# Patient Record
Sex: Female | Born: 1950
Health system: Southern US, Community
[De-identification: ages and names within clinical notes are randomized; demographics above are authoritative.]

## PROBLEM LIST (undated history)

## (undated) DIAGNOSIS — E785 Hyperlipidemia, unspecified: Secondary | ICD-10-CM

## (undated) DIAGNOSIS — K589 Irritable bowel syndrome without diarrhea: Secondary | ICD-10-CM

## (undated) DIAGNOSIS — J45909 Unspecified asthma, uncomplicated: Secondary | ICD-10-CM

## (undated) DIAGNOSIS — R51 Headache: Secondary | ICD-10-CM

## (undated) DIAGNOSIS — Z8619 Personal history of other infectious and parasitic diseases: Secondary | ICD-10-CM

## (undated) DIAGNOSIS — J302 Other seasonal allergic rhinitis: Secondary | ICD-10-CM

## (undated) DIAGNOSIS — B059 Measles without complication: Secondary | ICD-10-CM

## (undated) DIAGNOSIS — R002 Palpitations: Secondary | ICD-10-CM

## (undated) DIAGNOSIS — B019 Varicella without complication: Secondary | ICD-10-CM

## (undated) DIAGNOSIS — G4700 Insomnia, unspecified: Secondary | ICD-10-CM

## (undated) HISTORY — DX: Hyperlipidemia, unspecified: E78.5

## (undated) HISTORY — DX: Unspecified asthma, uncomplicated: J45.909

## (undated) HISTORY — DX: Insomnia, unspecified: G47.00

## (undated) HISTORY — PX: BREAST SURGERY: SHX581

## (undated) HISTORY — DX: Irritable bowel syndrome, unspecified: K58.9

## (undated) HISTORY — PX: BREAST BIOPSY: SHX20

## (undated) HISTORY — PX: BREAST EXCISIONAL BIOPSY: SUR124

## (undated) HISTORY — DX: Palpitations: R00.2

## (undated) HISTORY — DX: Other seasonal allergic rhinitis: J30.2

## (undated) HISTORY — PX: MANDIBLE SURGERY: SHX707

## (undated) HISTORY — DX: Varicella without complication: B01.9

## (undated) HISTORY — DX: Measles without complication: B05.9

## (undated) HISTORY — DX: Headache: R51

## (undated) HISTORY — DX: Personal history of other infectious and parasitic diseases: Z86.19

---

## 1970-07-31 HISTORY — PX: WISDOM TOOTH EXTRACTION: SHX21

## 1981-07-31 HISTORY — PX: DILATION AND CURETTAGE OF UTERUS: SHX78

## 1998-05-25 ENCOUNTER — Other Ambulatory Visit: Admission: RE | Admit: 1998-05-25 | Discharge: 1998-05-25 | Payer: Self-pay | Admitting: Obstetrics and Gynecology

## 2000-06-05 ENCOUNTER — Other Ambulatory Visit: Admission: RE | Admit: 2000-06-05 | Discharge: 2000-06-05 | Payer: Self-pay | Admitting: Obstetrics and Gynecology

## 2001-07-10 ENCOUNTER — Other Ambulatory Visit: Admission: RE | Admit: 2001-07-10 | Discharge: 2001-07-10 | Payer: Self-pay | Admitting: Obstetrics and Gynecology

## 2002-06-06 ENCOUNTER — Ambulatory Visit (HOSPITAL_COMMUNITY): Admission: RE | Admit: 2002-06-06 | Discharge: 2002-06-06 | Payer: Self-pay | Admitting: Gastroenterology

## 2002-07-31 LAB — HM COLONOSCOPY: HM Colonoscopy: NORMAL

## 2002-10-28 ENCOUNTER — Other Ambulatory Visit: Admission: RE | Admit: 2002-10-28 | Discharge: 2002-10-28 | Payer: Self-pay | Admitting: Obstetrics and Gynecology

## 2003-11-17 ENCOUNTER — Other Ambulatory Visit: Admission: RE | Admit: 2003-11-17 | Discharge: 2003-11-17 | Payer: Self-pay | Admitting: Obstetrics and Gynecology

## 2005-02-22 ENCOUNTER — Other Ambulatory Visit: Admission: RE | Admit: 2005-02-22 | Discharge: 2005-02-22 | Payer: Self-pay | Admitting: Obstetrics and Gynecology

## 2008-11-28 DEATH — deceased

## 2010-12-05 ENCOUNTER — Encounter: Payer: Self-pay | Admitting: Internal Medicine

## 2010-12-05 ENCOUNTER — Ambulatory Visit (INDEPENDENT_AMBULATORY_CARE_PROVIDER_SITE_OTHER): Payer: 59 | Admitting: Internal Medicine

## 2010-12-05 ENCOUNTER — Encounter: Payer: Self-pay | Admitting: *Deleted

## 2010-12-05 VITALS — BP 141/88 | HR 73 | Temp 97.2°F | Ht 63.0 in | Wt 128.4 lb

## 2010-12-05 DIAGNOSIS — J069 Acute upper respiratory infection, unspecified: Secondary | ICD-10-CM

## 2010-12-05 NOTE — Progress Notes (Signed)
Pt presents very congested, states fevers this weekend, general malaise. Voice very raspy. Will add to dr Reche Dixon [phone]

## 2010-12-05 NOTE — Progress Notes (Signed)
  Subjective:    Patient ID: Norma Greene, female    DOB: 04/09/1951, 60 y.o.   MRN: [id number]  HPI Here with 3 day history of dry cough, sore throat, hoarseness. No fevers, pleuritic sx's, SOB or chest tightness   Review of Systems     Objective:   Physical Exam Appears well, NAD Voice a little hoarse HEENT: No OP erythema                TM normal bilat                 Shotty cervical LAD                 Lungs CTA                  CAR: RRR no M/R/G       Assessment & Plan:  URI, most likely viral Continue OTC Meds Rest, fluids

## 2010-12-09 ENCOUNTER — Other Ambulatory Visit: Payer: Self-pay | Admitting: Internal Medicine

## 2010-12-09 MED ORDER — GUAIFENESIN-CODEINE 100-10 MG/5ML PO SYRP: ORAL_SOLUTION | ORAL | Status: DC

## 2010-12-09 MED ORDER — AZITHROMYCIN 250 MG PO TABS: ORAL_TABLET | ORAL | Status: AC

## 2010-12-09 NOTE — Progress Notes (Signed)
Norma Greene was seen earlier in the week for URI symptoms which have persisted and worsened. She has cough occasionally productive of yellow-green sputum; this is keeping her awake at night, laryngitis, 8 days of symptoms, and fatigue, malaise. On exam lungs are clear, as are TM's. Prescriptions for Zpak and narcotic cough syrup called in by me. Patient instructed to go home and rest.

## 2011-08-29 ENCOUNTER — Encounter: Payer: Self-pay | Admitting: Family Medicine

## 2011-08-29 ENCOUNTER — Ambulatory Visit (INDEPENDENT_AMBULATORY_CARE_PROVIDER_SITE_OTHER): Payer: 59 | Admitting: Family Medicine

## 2011-08-29 VITALS — BP 119/80 | HR 65 | Temp 98.1°F | Ht 64.0 in | Wt 124.0 lb

## 2011-08-29 DIAGNOSIS — M25562 Pain in left knee: Secondary | ICD-10-CM

## 2011-08-29 DIAGNOSIS — M25569 Pain in unspecified knee: Secondary | ICD-10-CM

## 2011-08-31 ENCOUNTER — Encounter: Payer: Self-pay | Admitting: Family Medicine

## 2011-08-31 DIAGNOSIS — M25562 Pain in left knee: Secondary | ICD-10-CM | POA: Insufficient documentation

## 2011-08-31 NOTE — Assessment & Plan Note (Signed)
Location of patient's pain and description, exam all consistent with a small lateral plica of her left knee.  No evidence ligamentous or meniscal injury.  Advised in a home exercise program (straight leg raises, VMO exercises primarily).  Ice knee, tylenol/motrin as needed.  Advised if not improving would consider formal PT and/or cortisone injection into area of plica.  F/u prn.

## 2011-08-31 NOTE — Progress Notes (Signed)
  Subjective:    Patient ID: Norma Greene, female    DOB: 1951-06-08, 61 y.o.   MRN: [id number]  PCP: Elby Showers  HPI 61 yo F here for left knee pain.  Patient denies known injury. She states approximately 3 weeks ago when getting up into bed she felt a sharp twinge superficially lateral to left kneecap. Pain was fleeting, not associated with swelling or bruising. Feels this mainly when knee is bent, climbing stairs. No locking, giving out.  Some catching in this location. No prior left knee injuries/surgeries.  Past Medical History  Diagnosis Date  . Allergy     No current outpatient prescriptions on file prior to visit.    History reviewed. No pertinent past surgical history.  No Known Allergies  History   Social History  . Marital Status: Single    Spouse Name: N/A    Number of Children: N/A  . Years of Education: N/A   Occupational History  . Not on file.   Social History Main Topics  . Smoking status: Never Smoker   . Smokeless tobacco: Not on file  . Alcohol Use: Yes  . Drug Use: Yes  . Sexually Active: Not on file   Other Topics Concern  . Not on file   Social History Narrative  . No narrative on file    Family History  Problem Relation Age of Onset  . Hypertension Father   . Hyperlipidemia Father   . Diabetes Neg Hx   . Heart attack Neg Hx   . Sudden death Neg Hx     BP 119/80  Pulse 65  Temp(Src) 98.1 F (36.7 C) (Oral)  Ht 5\' 4"  (1.626 m)  Wt 124 lb (56.246 kg)  BMI 21.28 kg/m2  Review of Systems See HPI above.    Objective:   Physical Exam Gen: NAD  L knee: No gross deformity, ecchymoses, swelling.  Palpable small soft tissue band felt between lateral patella and lateral femoral condyle. No TTP joint lines, post patellar facets, elsewhere about knee. FROM without j sign. Negative ant/post drawers. Negative valgus/varus testing. Negative lachmanns. Negative mcmurrays, apleys, patellar apprehension, clarkes. NV intact  distally. 5/5 strength left hip abduction.  R knee: FROM without pain, instability.    Assessment & Plan:  1. Left knee pain - Location of patient's pain and description, exam all consistent with a small lateral plica of her left knee.  No evidence ligamentous or meniscal injury.  Advised in a home exercise program (straight leg raises, VMO exercises primarily).  Ice knee, tylenol/motrin as needed.  Advised if not improving would consider formal PT and/or cortisone injection into area of plica.  F/u prn.

## 2013-11-28 LAB — HM PAP SMEAR: HM Pap smear: NORMAL

## 2013-11-28 LAB — HM MAMMOGRAPHY: HM MAMMO: NORMAL

## 2013-12-10 ENCOUNTER — Encounter: Payer: Self-pay | Admitting: Internal Medicine

## 2013-12-10 ENCOUNTER — Ambulatory Visit (INDEPENDENT_AMBULATORY_CARE_PROVIDER_SITE_OTHER): Payer: 59 | Admitting: Internal Medicine

## 2013-12-10 VITALS — BP 132/82 | HR 67 | Temp 96.7°F | Resp 20 | Ht 63.5 in | Wt 118.5 lb

## 2013-12-10 DIAGNOSIS — B0239 Other herpes zoster eye disease: Secondary | ICD-10-CM | POA: Insufficient documentation

## 2013-12-10 MED ORDER — GABAPENTIN 300 MG PO CAPS: 300.0000 mg | ORAL_CAPSULE | Freq: Two times a day (BID) | ORAL | Status: DC

## 2013-12-10 NOTE — Assessment & Plan Note (Signed)
Has completed 2 courses of Famcyclovir, seems to be improving, but has persistent neuropathy described as shooting and ants crawling on skin.  She is being closely followed by opthalmology (Dr. Estrella DeedsJohn Yoakum).  She has received relief with Gabapentin 300mg , 1 tab, 1-2x daily.  -Refill Gabapentin 300mg  BID #60, plan to taper down to daily in 1-2 weeks, and hopeful to taper off -RTC if symptoms do not resolve

## 2013-12-10 NOTE — Progress Notes (Signed)
Subjective:   Patient ID: Norma Greene female   DOB: 08/24/1950 63 y.o.   MRN: [id number]  Chief Complaint  Patient presents with  . shingles neuropathy around right face and head x 4 week    requesting Neurontin for control of neuropathy   HPI: Norma Greene is a 63 y.o. woman with h/o IBS who presents for acute visit.  Symptoms started about 5 weeks ago, initially noticed a shooting pain in the throughout the right side of her head followed by tingling for 3 days, and then rash (bumps) began to erupt.  On 4th day [facility] (Dr. Caswell CorwinJarralla, took over Dr. Claris CheWalsh's patients) and was prescribed famcyclovir TID x 7d.  Same day went to optho (Dr. Lillia PaulsYoacom), told looked okay, return in 1 week.  At that point could barely open eye due to eye swelling.  Completed famcyclovir course, but since no sig improvement, optho noticed something on cornea, and re-prescribed famcyclovir BID x 7 days.  Into the 1st week, she started taking Gabapentin 300mg  qHS #28.  Gabapentin alleviated shooting pain, but during the day couldn't stand to wear glasses - started trying gabapentin in the AM, and could tolerate glasses.  Continues to have some pruritis (feels like ants crawling), shooting pains and lesions seems to be improving but remains tender on the right side.    Optho appt on 12/01/13 with Dr. Estrella DeedsJohn Yoakum: Right Eye 20/70, Left eye 20/30 - this is her baseline -Patient advised that contact lens wear stimulates the virus.  Unclear why PCP wouldn't prescribe.  May 22nd - appt with optho  Review of Systems: Constitutional: Denies fever, chills, diaphoresis. +Decreased appetite and energy HEENT: Denies hearing loss, ear pain, congestion, sore throat, mouth sores, trouble swallowing, neck pain, neck stiffness and tinnitus. +photophobia, occ eye pain, intermittent red eye - uses alaway eye drops BID; +rhinorrhea, sneezing Respiratory: Denies SOB, DOE, cough, chest tightness, and wheezing.    Cardiovascular: Denies chest pain, palpitations and leg swelling.  Gastrointestinal: Denies nausea, vomiting, abdominal pain, diarrhea, constipation,blood in stool and abdominal distention.  Genitourinary: Denies dysuria, urgency, frequency, hematuria, flank pain and difficulty urinating.  Musculoskeletal: Denies myalgias, back pain, joint swelling, arthralgias and gait problem.  Skin: Denies pallor, rash and wound.  Neurological: Denies dizziness, seizures, syncope, weakness, lightheadedness, numbness   Hematological: no obvious s/s of bleeding    Past Medical History  Diagnosis Date  . Allergy    Current Outpatient Prescriptions  Medication Sig Dispense Refill  . fexofenadine (ALLEGRA) 180 MG tablet Take 180 mg by mouth daily.      Marland Kitchen. PEG 3350-[id number]-[id number] (GOLYTELY PO) Take by mouth.       No current facility-administered medications for this visit.   Family History  Problem Relation Age of Onset  . Hypertension Father   . Hyperlipidemia Father   . Diabetes Neg Hx   . Heart attack Neg Hx   . Sudden death Neg Hx   . Heart failure Father   . Lung cancer Father   . Atrial fibrillation Father   . Atrial fibrillation Mother    History   Social History  . Marital Status: Single    Spouse Name: N/A    Number of Children: 5  . Years of Education: Nursing [employer]   Occupational History  .  [employer]   Social History Main Topics  . Smoking status: Never Smoker   . Smokeless tobacco: None  . Alcohol Use: Yes  Comment: Wine on weekends  . Drug Use: No  . Sexual Activity: None   Other Topics Concern  . None   Social History Narrative  . None    Objective:  Physical Exam: Filed Vitals:   12/10/13 1313  BP: 132/82  Pulse: 67  Temp: 96.7 F (35.9 C)  TempSrc: Oral  Resp: 20  Height: 5' 3.5" (1.613 m)  Weight: 118 lb 8 oz (53.751 kg)  SpO2: 99%   General: resting in bed HEENT: PERRL, EOMI, no scleral icterus, erythema surrounding skin on right eye  extends up through forehead with largest lesion over right lateral eyebrow but without significant fluid, barely raised, vision grossly intact Cardiac: RRR, no rubs, murmurs or gallops Pulm: clear to auscultation bilaterally, moving normal volumes of air Abd: soft, nontender, nondistended, BS present Ext: warm and well perfused, no pedal edema Neuro: alert and oriented X3, cranial nerves II-XII grossly intact  Assessment & Plan:   Case and care discussed with Dr. Meredith PelJoines.  Please see problem oriented charting for further details.

## 2013-12-10 NOTE — Progress Notes (Signed)
Case discussed with Dr. Sharda at the time of the visit.  We reviewed the resident's history and exam and pertinent patient test results.  I agree with the assessment, diagnosis, and plan of care documented in the resident's note.    

## 2013-12-10 NOTE — Patient Instructions (Signed)
-  You may take Gabapentin 300mg  twice daily - try to taper down to once daily in 1-2 weeks  Please be sure to bring all of your medications with you to every visit.  Should you have any new or worsening symptoms, please be sure to call the clinic at [phone].

## 2014-05-12 ENCOUNTER — Encounter: Payer: Self-pay | Admitting: Family Medicine

## 2014-05-12 ENCOUNTER — Ambulatory Visit (INDEPENDENT_AMBULATORY_CARE_PROVIDER_SITE_OTHER): Payer: 59 | Admitting: Family Medicine

## 2014-05-12 VITALS — BP 116/73 | HR 60 | Temp 98.5°F | Ht 63.5 in | Wt 115.2 lb

## 2014-05-12 DIAGNOSIS — G4700 Insomnia, unspecified: Secondary | ICD-10-CM

## 2014-05-12 DIAGNOSIS — T7840XA Allergy, unspecified, initial encounter: Secondary | ICD-10-CM

## 2014-05-12 DIAGNOSIS — R519 Headache, unspecified: Secondary | ICD-10-CM

## 2014-05-12 DIAGNOSIS — Z8619 Personal history of other infectious and parasitic diseases: Secondary | ICD-10-CM | POA: Insufficient documentation

## 2014-05-12 DIAGNOSIS — R51 Headache: Secondary | ICD-10-CM

## 2014-05-12 DIAGNOSIS — K589 Irritable bowel syndrome without diarrhea: Secondary | ICD-10-CM

## 2014-05-12 DIAGNOSIS — B059 Measles without complication: Secondary | ICD-10-CM | POA: Insufficient documentation

## 2014-05-12 NOTE — Progress Notes (Signed)
Pre visit review using our clinic review tool, if applicable. No additional management support is needed unless otherwise documented below in the visit note. 

## 2014-05-12 NOTE — Patient Instructions (Signed)
Curcumen/Turmeric daily Krill Oil caps daily Vitamin D 2000 IU dialy Probiotic daily such as [employer] or [place] at [email or web address]  Zinc and elderberry for colds   Preventive Care for Adults A healthy lifestyle and preventive care can promote health and wellness. Preventive health guidelines for women include the following key practices.  A routine yearly physical is a good way to check with your health care provider about your health and preventive screening. It is a chance to share any concerns and updates on your health and to receive a thorough exam.  Visit your dentist for a routine exam and preventive care every 6 months. Brush your teeth twice a day and floss once a day. Good oral hygiene prevents tooth decay and gum disease.  The frequency of eye exams is based on your age, health, family medical history, use of contact lenses, and other factors. Follow your health care provider's recommendations for frequency of eye exams.  Eat a healthy diet. Foods like vegetables, fruits, whole grains, low-fat dairy products, and lean protein foods contain the nutrients you need without too many calories. Decrease your intake of foods high in solid fats, added sugars, and salt. Eat the right amount of calories for you.Get information about a proper diet from your health care provider, if necessary.  Regular physical exercise is one of the most important things you can do for your health. Most adults should get at least 150 minutes of moderate-intensity exercise (any activity that increases your heart rate and causes you to sweat) each week. In addition, most adults need muscle-strengthening exercises on 2 or more days a week.  Maintain a healthy weight. The body mass index (BMI) is a screening tool to identify possible weight problems. It provides an estimate of body fat based on height and weight. Your health care provider can find your BMI and can help you  achieve or maintain a healthy weight.For adults 20 years and older:  A BMI below 18.5 is considered underweight.  A BMI of 18.5 to 24.9 is normal.  A BMI of 25 to 29.9 is considered overweight.  A BMI of 30 and above is considered obese.  Maintain normal blood lipids and cholesterol levels by exercising and minimizing your intake of saturated fat. Eat a balanced diet with plenty of fruit and vegetables. Blood tests for lipids and cholesterol should begin at age 42 and be repeated every 5 years. If your lipid or cholesterol levels are high, you are over 50, or you are at high risk for heart disease, you may need your cholesterol levels checked more frequently.Ongoing high lipid and cholesterol levels should be treated with medicines if diet and exercise are not working.  If you smoke, find out from your health care provider how to quit. If you do not use tobacco, do not start.  Lung cancer screening is recommended for adults aged 28-80 years who are at high risk for developing lung cancer because of a history of smoking. A yearly low-dose CT scan of the lungs is recommended for people who have at least a 30-pack-year history of smoking and are a current smoker or have quit within the past 15 years. A pack year of smoking is smoking an average of 1 pack of cigarettes a day for 1 year (for example: 1 pack a day for 30 years or 2 packs a day for 15 years). Yearly screening should continue until the smoker has stopped smoking for at least 15 years. Yearly  screening should be stopped for people who develop a health problem that would prevent them from having lung cancer treatment.  If you are pregnant, do not drink alcohol. If you are breastfeeding, be very cautious about drinking alcohol. If you are not pregnant and choose to drink alcohol, do not have more than 1 drink per day. One drink is considered to be 12 ounces (355 mL) of beer, 5 ounces (148 mL) of wine, or 1.5 ounces (44 mL) of liquor.  Avoid  use of street drugs. Do not share needles with anyone. Ask for help if you need support or instructions about stopping the use of drugs.  High blood pressure causes heart disease and increases the risk of stroke. Your blood pressure should be checked at least every 1 to 2 years. Ongoing high blood pressure should be treated with medicines if weight loss and exercise do not work.  If you are 109-59 years old, ask your health care provider if you should take aspirin to prevent strokes.  Diabetes screening involves taking a blood sample to check your fasting blood sugar level. This should be done once every 3 years, after age 16, if you are within normal weight and without risk factors for diabetes. Testing should be considered at a younger age or be carried out more frequently if you are overweight and have at least 1 risk factor for diabetes.  Breast cancer screening is essential preventive care for women. You should practice "breast self-awareness." This means understanding the normal appearance and feel of your breasts and may include breast self-examination. Any changes detected, no matter how small, should be reported to a health care provider. Women in their 60s and 30s should have a clinical breast exam (CBE) by a health care provider as part of a regular health exam every 1 to 3 years. After age 43, women should have a CBE every year. Starting at age 29, women should consider having a mammogram (breast X-ray test) every year. Women who have a family history of breast cancer should talk to their health care provider about genetic screening. Women at a high risk of breast cancer should talk to their health care providers about having an MRI and a mammogram every year.  Breast cancer gene (BRCA)-related cancer risk assessment is recommended for women who have family members with BRCA-related cancers. BRCA-related cancers include breast, ovarian, tubal, and peritoneal cancers. Having family members with  these cancers may be associated with an increased risk for harmful changes (mutations) in the breast cancer genes BRCA1 and BRCA2. Results of the assessment will determine the need for genetic counseling and BRCA1 and BRCA2 testing.  Routine pelvic exams to screen for cancer are no longer recommended for nonpregnant women who are considered low risk for cancer of the pelvic organs (ovaries, uterus, and vagina) and who do not have symptoms. Ask your health care provider if a screening pelvic exam is right for you.  If you have had past treatment for cervical cancer or a condition that could lead to cancer, you need Pap tests and screening for cancer for at least 20 years after your treatment. If Pap tests have been discontinued, your risk factors (such as having a new sexual partner) need to be reassessed to determine if screening should be resumed. Some women have medical problems that increase the chance of getting cervical cancer. In these cases, your health care provider may recommend more frequent screening and Pap tests.  The HPV test is an additional test  that may be used for cervical cancer screening. The HPV test looks for the virus that can cause the cell changes on the cervix. The cells collected during the Pap test can be tested for HPV. The HPV test could be used to screen women aged 82 years and older, and should be used in women of any age who have unclear Pap test results. After the age of 88, women should have HPV testing at the same frequency as a Pap test.  Colorectal cancer can be detected and often prevented. Most routine colorectal cancer screening begins at the age of 51 years and continues through age 11 years. However, your health care provider may recommend screening at an earlier age if you have risk factors for colon cancer. On a yearly basis, your health care provider may provide home test kits to check for hidden blood in the stool. Use of a small camera at the end of a tube, to  directly examine the colon (sigmoidoscopy or colonoscopy), can detect the earliest forms of colorectal cancer. Talk to your health care provider about this at age 20, when routine screening begins. Direct exam of the colon should be repeated every 5-10 years through age 92 years, unless early forms of pre-cancerous polyps or small growths are found.  People who are at an increased risk for hepatitis B should be screened for this virus. You are considered at high risk for hepatitis B if:  You were born in a country where hepatitis B occurs often. Talk with your health care provider about which countries are considered high risk.  Your parents were born in a high-risk country and you have not received a shot to protect against hepatitis B (hepatitis B vaccine).  You have HIV or AIDS.  You use needles to inject street drugs.  You live with, or have sex with, someone who has hepatitis B.  You get hemodialysis treatment.  You take certain medicines for conditions like cancer, organ transplantation, and autoimmune conditions.  Hepatitis C blood testing is recommended for all people born from [birth year] through 1965 and any individual with known risks for hepatitis C.  Practice safe sex. Use condoms and avoid high-risk sexual practices to reduce the spread of sexually transmitted infections (STIs). STIs include gonorrhea, chlamydia, syphilis, trichomonas, herpes, HPV, and human immunodeficiency virus (HIV). Herpes, HIV, and HPV are viral illnesses that have no cure. They can result in disability, cancer, and death.  You should be screened for sexually transmitted illnesses (STIs) including gonorrhea and chlamydia if:  You are sexually active and are younger than 24 years.  You are older than 24 years and your health care provider tells you that you are at risk for this type of infection.  Your sexual activity has changed since you were last screened and you are at an increased risk for chlamydia or  gonorrhea. Ask your health care provider if you are at risk.  If you are at risk of being infected with HIV, it is recommended that you take a prescription medicine daily to prevent HIV infection. This is called preexposure prophylaxis (PrEP). You are considered at risk if:  You are a heterosexual woman, are sexually active, and are at increased risk for HIV infection.  You take drugs by injection.  You are sexually active with a partner who has HIV.  Talk with your health care provider about whether you are at high risk of being infected with HIV. If you choose to begin PrEP, you should first  be tested for HIV. You should then be tested every 3 months for as long as you are taking PrEP.  Osteoporosis is a disease in which the bones lose minerals and strength with aging. This can result in serious bone fractures or breaks. The risk of osteoporosis can be identified using a bone density scan. Women ages 39 years and over and women at risk for fractures or osteoporosis should discuss screening with their health care providers. Ask your health care provider whether you should take a calcium supplement or vitamin D to reduce the rate of osteoporosis.  Menopause can be associated with physical symptoms and risks. Hormone replacement therapy is available to decrease symptoms and risks. You should talk to your health care provider about whether hormone replacement therapy is right for you.  Use sunscreen. Apply sunscreen liberally and repeatedly throughout the day. You should seek shade when your shadow is shorter than you. Protect yourself by wearing long sleeves, pants, a wide-brimmed hat, and sunglasses year round, whenever you are outdoors.  Once a month, do a whole body skin exam, using a mirror to look at the skin on your back. Tell your health care provider of new moles, moles that have irregular borders, moles that are larger than a pencil eraser, or moles that have changed in shape or  color.  Stay current with required vaccines (immunizations).  Influenza vaccine. All adults should be immunized every year.  Tetanus, diphtheria, and acellular pertussis (Td, Tdap) vaccine. Pregnant women should receive 1 dose of Tdap vaccine during each pregnancy. The dose should be obtained regardless of the length of time since the last dose. Immunization is preferred during the 27th-36th week of gestation. An adult who has not previously received Tdap or who does not know her vaccine status should receive 1 dose of Tdap. This initial dose should be followed by tetanus and diphtheria toxoids (Td) booster doses every 10 years. Adults with an unknown or incomplete history of completing a 3-dose immunization series with Td-containing vaccines should begin or complete a primary immunization series including a Tdap dose. Adults should receive a Td booster every 10 years.  Varicella vaccine. An adult without evidence of immunity to varicella should receive 2 doses or a second dose if she has previously received 1 dose. Pregnant females who do not have evidence of immunity should receive the first dose after pregnancy. This first dose should be obtained before leaving the health care facility. The second dose should be obtained 4-8 weeks after the first dose.  Human papillomavirus (HPV) vaccine. Females aged 13-26 years who have not received the vaccine previously should obtain the 3-dose series. The vaccine is not recommended for use in pregnant females. However, pregnancy testing is not needed before receiving a dose. If a female is found to be pregnant after receiving a dose, no treatment is needed. In that case, the remaining doses should be delayed until after the pregnancy. Immunization is recommended for any person with an immunocompromised condition through the age of 86 years if she did not get any or all doses earlier. During the 3-dose series, the second dose should be obtained 4-8 weeks after the  first dose. The third dose should be obtained 24 weeks after the first dose and 16 weeks after the second dose.  Zoster vaccine. One dose is recommended for adults aged 48 years or older unless certain conditions are present.  Measles, mumps, and rubella (MMR) vaccine. Adults born before [birth year] generally are considered immune to measles  and mumps. Adults born in [birth year] or later should have 1 or more doses of MMR vaccine unless there is a contraindication to the vaccine or there is laboratory evidence of immunity to each of the three diseases. A routine second dose of MMR vaccine should be obtained at least 28 days after the first dose for students attending postsecondary schools, health care workers, or international travelers. People who received inactivated measles vaccine or an unknown type of measles vaccine during 1963-1967 should receive 2 doses of MMR vaccine. People who received inactivated mumps vaccine or an unknown type of mumps vaccine before 1979 and are at high risk for mumps infection should consider immunization with 2 doses of MMR vaccine. For females of childbearing age, rubella immunity should be determined. If there is no evidence of immunity, females who are not pregnant should be vaccinated. If there is no evidence of immunity, females who are pregnant should delay immunization until after pregnancy. Unvaccinated health care workers born before [birth year] who lack laboratory evidence of measles, mumps, or rubella immunity or laboratory confirmation of disease should consider measles and mumps immunization with 2 doses of MMR vaccine or rubella immunization with 1 dose of MMR vaccine.  Pneumococcal 13-valent conjugate (PCV13) vaccine. When indicated, a person who is uncertain of her immunization history and has no record of immunization should receive the PCV13 vaccine. An adult aged 32 years or older who has certain medical conditions and has not been previously immunized should receive 1 dose of  PCV13 vaccine. This PCV13 should be followed with a dose of pneumococcal polysaccharide (PPSV23) vaccine. The PPSV23 vaccine dose should be obtained at least 8 weeks after the dose of PCV13 vaccine. An adult aged 7 years or older who has certain medical conditions and previously received 1 or more doses of PPSV23 vaccine should receive 1 dose of PCV13. The PCV13 vaccine dose should be obtained 1 or more years after the last PPSV23 vaccine dose.  Pneumococcal polysaccharide (PPSV23) vaccine. When PCV13 is also indicated, PCV13 should be obtained first. All adults aged 71 years and older should be immunized. An adult younger than age 51 years who has certain medical conditions should be immunized. Any person who resides in a nursing home or long-term care facility should be immunized. An adult smoker should be immunized. People with an immunocompromised condition and certain other conditions should receive both PCV13 and PPSV23 vaccines. People with human immunodeficiency virus (HIV) infection should be immunized as soon as possible after diagnosis. Immunization during chemotherapy or radiation therapy should be avoided. Routine use of PPSV23 vaccine is not recommended for American Indians, [place] Natives, or people younger than 65 years unless there are medical conditions that require PPSV23 vaccine. When indicated, people who have unknown immunization and have no record of immunization should receive PPSV23 vaccine. One-time revaccination 5 years after the first dose of PPSV23 is recommended for people aged 19-64 years who have chronic kidney failure, nephrotic syndrome, asplenia, or immunocompromised conditions. People who received 1-2 doses of PPSV23 before age 16 years should receive another dose of PPSV23 vaccine at age 47 years or later if at least 5 years have passed since the previous dose. Doses of PPSV23 are not needed for people immunized with PPSV23 at or after age 37 years.  Meningococcal vaccine.  Adults with asplenia or persistent complement component deficiencies should receive 2 doses of quadrivalent meningococcal conjugate (MenACWY-D) vaccine. The doses should be obtained at least 2 months apart. Microbiologists working with certain meningococcal bacteria,  [employer] recruits, people at risk during an outbreak, and people who travel to or live in countries with a high rate of meningitis should be immunized. A first-year college student up through age 40 years who is living in a residence hall should receive a dose if she did not receive a dose on or after her 16th birthday. Adults who have certain high-risk conditions should receive one or more doses of vaccine.  Hepatitis A vaccine. Adults who wish to be protected from this disease, have certain high-risk conditions, work with hepatitis A-infected animals, work in hepatitis A research labs, or travel to or work in countries with a high rate of hepatitis A should be immunized. Adults who were previously unvaccinated and who anticipate close contact with an international adoptee during the first 60 days after arrival in the [place] from a country with a high rate of hepatitis A should be immunized.  Hepatitis B vaccine. Adults who wish to be protected from this disease, have certain high-risk conditions, may be exposed to blood or other infectious body fluids, are household contacts or sex partners of hepatitis B positive people, are clients or workers in certain care facilities, or travel to or work in countries with a high rate of hepatitis B should be immunized.  Haemophilus influenzae type b (Hib) vaccine. A previously unvaccinated person with asplenia or sickle cell disease or having a scheduled splenectomy should receive 1 dose of Hib vaccine. Regardless of previous immunization, a recipient of a hematopoietic stem cell transplant should receive a 3-dose series 6-12 months after her successful transplant. Hib vaccine is not recommended for  adults with HIV infection. Preventive Services / Frequency Ages 12 to 24 years  Blood pressure check.** / Every 1 to 2 years.  Lipid and cholesterol check.** / Every 5 years beginning at age 10.  Clinical breast exam.** / Every 3 years for women in their 26s and 3s.  BRCA-related cancer risk assessment.** / For women who have family members with a BRCA-related cancer (breast, ovarian, tubal, or peritoneal cancers).  Pap test.** / Every 2 years from ages 12 through 50. Every 3 years starting at age 36 through age 57 or 32 with a history of 3 consecutive normal Pap tests.  HPV screening.** / Every 3 years from ages 64 through ages 91 to 72 with a history of 3 consecutive normal Pap tests.  Hepatitis C blood test.** / For any individual with known risks for hepatitis C.  Skin self-exam. / Monthly.  Influenza vaccine. / Every year.  Tetanus, diphtheria, and acellular pertussis (Tdap, Td) vaccine.** / Consult your health care provider. Pregnant women should receive 1 dose of Tdap vaccine during each pregnancy. 1 dose of Td every 10 years.  Varicella vaccine.** / Consult your health care provider. Pregnant females who do not have evidence of immunity should receive the first dose after pregnancy.  HPV vaccine. / 3 doses over 6 months, if 30 and younger. The vaccine is not recommended for use in pregnant females. However, pregnancy testing is not needed before receiving a dose.  Measles, mumps, rubella (MMR) vaccine.** / You need at least 1 dose of MMR if you were born in 1957 or later. You may also need a 2nd dose. For females of childbearing age, rubella immunity should be determined. If there is no evidence of immunity, females who are not pregnant should be vaccinated. If there is no evidence of immunity, females who are pregnant should delay immunization until after pregnancy.  Pneumococcal  13-valent conjugate (PCV13) vaccine.** / Consult your health care provider.  Pneumococcal  polysaccharide (PPSV23) vaccine.** / 1 to 2 doses if you smoke cigarettes or if you have certain conditions.  Meningococcal vaccine.** / 1 dose if you are age 52 to 61 years and a Market researcher living in a residence hall, or have one of several medical conditions, you need to get vaccinated against meningococcal disease. You may also need additional booster doses.  Hepatitis A vaccine.** / Consult your health care provider.  Hepatitis B vaccine.** / Consult your health care provider.  Haemophilus influenzae type b (Hib) vaccine.** / Consult your health care provider. Ages 68 to 71 years  Blood pressure check.** / Every 1 to 2 years.  Lipid and cholesterol check.** / Every 5 years beginning at age 55 years.  Lung cancer screening. / Every year if you are aged 77-80 years and have a 30-pack-year history of smoking and currently smoke or have quit within the past 15 years. Yearly screening is stopped once you have quit smoking for at least 15 years or develop a health problem that would prevent you from having lung cancer treatment.  Clinical breast exam.** / Every year after age 87 years.  BRCA-related cancer risk assessment.** / For women who have family members with a BRCA-related cancer (breast, ovarian, tubal, or peritoneal cancers).  Mammogram.** / Every year beginning at age 60 years and continuing for as long as you are in good health. Consult with your health care provider.  Pap test.** / Every 3 years starting at age 77 years through age 28 or 62 years with a history of 3 consecutive normal Pap tests.  HPV screening.** / Every 3 years from ages 45 years through ages 30 to 72 years with a history of 3 consecutive normal Pap tests.  Fecal occult blood test (FOBT) of stool. / Every year beginning at age 38 years and continuing until age 25 years. You may not need to do this test if you get a colonoscopy every 10 years.  Flexible sigmoidoscopy or colonoscopy.** / Every 5  years for a flexible sigmoidoscopy or every 10 years for a colonoscopy beginning at age 30 years and continuing until age 23 years.  Hepatitis C blood test.** / For all people born from [birth year] through 1965 and any individual with known risks for hepatitis C.  Skin self-exam. / Monthly.  Influenza vaccine. / Every year.  Tetanus, diphtheria, and acellular pertussis (Tdap/Td) vaccine.** / Consult your health care provider. Pregnant women should receive 1 dose of Tdap vaccine during each pregnancy. 1 dose of Td every 10 years.  Varicella vaccine.** / Consult your health care provider. Pregnant females who do not have evidence of immunity should receive the first dose after pregnancy.  Zoster vaccine.** / 1 dose for adults aged 38 years or older.  Measles, mumps, rubella (MMR) vaccine.** / You need at least 1 dose of MMR if you were born in 1957 or later. You may also need a 2nd dose. For females of childbearing age, rubella immunity should be determined. If there is no evidence of immunity, females who are not pregnant should be vaccinated. If there is no evidence of immunity, females who are pregnant should delay immunization until after pregnancy.  Pneumococcal 13-valent conjugate (PCV13) vaccine.** / Consult your health care provider.  Pneumococcal polysaccharide (PPSV23) vaccine.** / 1 to 2 doses if you smoke cigarettes or if you have certain conditions.  Meningococcal vaccine.** / Consult your health care provider.  Hepatitis A vaccine.** / Consult your health care provider.  Hepatitis B vaccine.** / Consult your health care provider.  Haemophilus influenzae type b (Hib) vaccine.** / Consult your health care provider. Ages 33 years and over  Blood pressure check.** / Every 1 to 2 years.  Lipid and cholesterol check.** / Every 5 years beginning at age 23 years.  Lung cancer screening. / Every year if you are aged 38-80 years and have a 30-pack-year history of smoking and currently  smoke or have quit within the past 15 years. Yearly screening is stopped once you have quit smoking for at least 15 years or develop a health problem that would prevent you from having lung cancer treatment.  Clinical breast exam.** / Every year after age 39 years.  BRCA-related cancer risk assessment.** / For women who have family members with a BRCA-related cancer (breast, ovarian, tubal, or peritoneal cancers).  Mammogram.** / Every year beginning at age 74 years and continuing for as long as you are in good health. Consult with your health care provider.  Pap test.** / Every 3 years starting at age 34 years through age 31 or 72 years with 3 consecutive normal Pap tests. Testing can be stopped between 65 and 70 years with 3 consecutive normal Pap tests and no abnormal Pap or HPV tests in the past 10 years.  HPV screening.** / Every 3 years from ages 3 years through ages 50 or 88 years with a history of 3 consecutive normal Pap tests. Testing can be stopped between 65 and 70 years with 3 consecutive normal Pap tests and no abnormal Pap or HPV tests in the past 10 years.  Fecal occult blood test (FOBT) of stool. / Every year beginning at age 36 years and continuing until age 40 years. You may not need to do this test if you get a colonoscopy every 10 years.  Flexible sigmoidoscopy or colonoscopy.** / Every 5 years for a flexible sigmoidoscopy or every 10 years for a colonoscopy beginning at age 57 years and continuing until age 49 years.  Hepatitis C blood test.** / For all people born from [birth year] through 1965 and any individual with known risks for hepatitis C.  Osteoporosis screening.** / A one-time screening for women ages 26 years and over and women at risk for fractures or osteoporosis.  Skin self-exam. / Monthly.  Influenza vaccine. / Every year.  Tetanus, diphtheria, and acellular pertussis (Tdap/Td) vaccine.** / 1 dose of Td every 10 years.  Varicella vaccine.** / Consult your  health care provider.  Zoster vaccine.** / 1 dose for adults aged 104 years or older.  Pneumococcal 13-valent conjugate (PCV13) vaccine.** / Consult your health care provider.  Pneumococcal polysaccharide (PPSV23) vaccine.** / 1 dose for all adults aged 19 years and older.  Meningococcal vaccine.** / Consult your health care provider.  Hepatitis A vaccine.** / Consult your health care provider.  Hepatitis B vaccine.** / Consult your health care provider.  Haemophilus influenzae type b (Hib) vaccine.** / Consult your health care provider. ** Family history and personal history of risk and conditions may change your health care provider's recommendations. Document Released: 09/12/2001 Document Revised: 12/01/2013 Document Reviewed: 12/12/2010 [facility] Patient Information 2015 [place], [place]. This information is not intended to replace advice given to you by your health care provider. Make sure you discuss any questions you have with your health care provider.

## 2014-05-17 ENCOUNTER — Encounter: Payer: Self-pay | Admitting: Family Medicine

## 2014-05-17 DIAGNOSIS — T7840XA Allergy, unspecified, initial encounter: Secondary | ICD-10-CM | POA: Insufficient documentation

## 2014-05-17 DIAGNOSIS — K589 Irritable bowel syndrome without diarrhea: Secondary | ICD-10-CM | POA: Insufficient documentation

## 2014-05-17 DIAGNOSIS — G4700 Insomnia, unspecified: Secondary | ICD-10-CM

## 2014-05-17 DIAGNOSIS — R519 Headache, unspecified: Secondary | ICD-10-CM

## 2014-05-17 DIAGNOSIS — R51 Headache: Secondary | ICD-10-CM

## 2014-05-17 HISTORY — DX: Headache, unspecified: R51.9

## 2014-05-17 HISTORY — DX: Insomnia, unspecified: G47.00

## 2014-05-17 NOTE — Assessment & Plan Note (Signed)
May use antihistamines daily

## 2014-05-17 NOTE — Progress Notes (Signed)
Patient ID: Norma Greene, female   DOB: 06/17/1951, 63 y.o.   MRN: [id number] Norma Greene [id number] 10/15/1950 05/17/2014      Progress Note New Patient  Subjective  Chief Complaint  Chief Complaint  Patient presents with  . Establish Care    new patient    HPI  Patient is a 63 year old female in today for routine medical care. Patient in today to establish care. She struggles with her headaches she believes her sinus congestion from allergies. Rarely has migrainous symptoms with nausea phobia and has chronic GI symptoms including constipation and diarrhea he manages it no recent acute illness. Follows with Dr. Troy SineMedhoff of gastroenterology. Follows with Dr. Marcelle OverlieHolland of GYN who manages her Paps, mammograms and DEXA scans. Denies CP/palp/SOB/HA/congestion/fevers/GI or GU c/o. Taking meds as prescribed  Past Medical History  Diagnosis Date  . Allergy   . IBS (irritable bowel syndrome)     Dr. Kinnie ScalesMedoff  . Chicken pox as a child  . Measles as a child  . Asthma     allergic  . Allergic state 05/17/2014  . Insomnia 05/17/2014  . Headache 05/17/2014    Past Surgical History  Procedure Laterality Date  . Mandible surgery  1985 and 1996  . Wisdom tooth extraction  1972  . Dilation and curettage of uterus  1983    missed AB  . Breast surgery      b/l lumpectomy    Family History  Problem Relation Age of Onset  . Hypertension Father   . Hyperlipidemia Father   . Heart failure Father   . Atrial fibrillation Father   . Cancer Father     lung, soft tissue in hand, thigh, basal cell in ear- smoker then quit  . Heart attack Neg Hx   . Sudden death Neg Hx   . Atrial fibrillation Mother   . Dementia Mother   . Hypertension Son   . Cancer Maternal Grandmother     ? breast cancer- masectomy  . Heart disease Paternal Grandmother   . Heart disease Paternal Grandfather   . Depression Daughter     History   Social History  . Marital Status: Married    Spouse Name: N/A   Number of Children: 5  . Years of Education: Nursing [employer]   Occupational History  .  [employer]   Social History Main Topics  . Smoking status: Never Smoker   . Smokeless tobacco: Never Used  . Alcohol Use: Yes     Comment: Wine on weekends  . Drug Use: No  . 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 Not on file   Social History Narrative  . No narrative on file    Current Outpatient Prescriptions on File Prior to Visit  Medication Sig Dispense Refill  . fexofenadine (ALLEGRA) 180 MG tablet Take 180 mg by mouth daily as needed.       Marland Kitchen. PEG 3350-KCl-NaBcb-NaCl-NaSulf (GOLYTELY PO) Take by mouth.       No current facility-administered medications on file prior to visit.    No Known Allergies  Review of Systems  Review of Systems  Constitutional: Negative for fever, chills and malaise/fatigue.  HENT: Positive for congestion. Negative for hearing loss and nosebleeds.   Eyes: Negative for discharge.  Respiratory: Negative for cough, sputum production,  shortness of breath and wheezing.   Cardiovascular: Negative for chest pain, palpitations and leg swelling.  Gastrointestinal: Negative for heartburn, nausea, vomiting, abdominal pain, diarrhea, constipation and blood in stool.  Genitourinary: Negative for dysuria, urgency, frequency and hematuria.  Musculoskeletal: Positive for joint pain. Negative for back pain, falls and myalgias.  Skin: Negative for rash.  Neurological: Positive for headaches. Negative for dizziness, tremors, sensory change, focal weakness, loss of consciousness and weakness.  Endo/Heme/Allergies: Negative for polydipsia. Does not bruise/bleed easily.  Psychiatric/Behavioral: Negative for depression and suicidal ideas. The patient is not nervous/anxious and does not have insomnia.     Objective  BP 116/73  Pulse 60   Temp(Src) 98.5 F (36.9 C) (Oral)  Ht 5' 3.5" (1.613 m)  Wt 115 lb 3.2 oz (52.254 kg)  BMI 20.08 kg/m2  SpO2 98%  Physical Exam  Physical Exam  Constitutional: She is oriented to person, place, and time and well-developed, well-nourished, and in no distress. No distress.  HENT:  Head: Normocephalic and atraumatic.  Right Ear: External ear normal.  Left Ear: External ear normal.  Nose: Nose normal.  Mouth/Throat: Oropharynx is clear and moist. No oropharyngeal exudate.  Eyes: Conjunctivae are normal. Pupils are equal, round, and reactive to light. Right eye exhibits no discharge. Left eye exhibits no discharge. No scleral icterus.  Neck: Normal range of motion. Neck supple. No thyromegaly present.  Cardiovascular: Normal rate, regular rhythm, normal heart sounds and intact distal pulses.   No murmur heard. Pulmonary/Chest: Effort normal and breath sounds normal. No respiratory distress. She has no wheezes. She has no rales.  Abdominal: Soft. Bowel sounds are normal. She exhibits no distension and no mass. There is no tenderness.  Musculoskeletal: Normal range of motion. She exhibits no edema and no tenderness.  Lymphadenopathy:    She has no cervical adenopathy.  Neurological: She is alert and oriented to person, place, and time. She has normal reflexes. No cranial nerve deficit. Coordination normal.  Skin: Skin is warm and dry. No rash noted. She is not diaphoretic.  Psychiatric: Mood, memory and affect normal.       Assessment & Plan  IBS (irritable bowel syndrome) Uses Golytely routinely to manage symptoms. Avoid offending foods, start probiotics. Do not eat large meals in late evening and consider raising head of bed.   Allergic state May use antihistamines daily   Insomnia Encouraged good sleep hygiene such as dark, quiet room. No blue/green glowing lights such as computer screens in bedroom. No alcohol or stimulants in evening. Cut down on caffeine as able. Regular  exercise is helpful but not just prior to bed time. Using Sominex prn, may continue  Headache Encouraged increased hydration, 64 ounces of clear fluids daily. Minimize alcohol and caffeine. Eat small frequent meals with lean proteins and complex carbs. Avoid high and low blood sugars. Get adequate sleep, 7-8 hours a night. Needs exercise daily preferably in the morning.

## 2014-05-17 NOTE — Assessment & Plan Note (Signed)
Encouraged good sleep hygiene such as dark, quiet room. No blue/green glowing lights such as computer screens in bedroom. No alcohol or stimulants in evening. Cut down on caffeine as able. Regular exercise is helpful but not just prior to bed time. Using Sominex prn, may continue

## 2014-05-17 NOTE — Assessment & Plan Note (Signed)
Encouraged increased hydration, 64 ounces of clear fluids daily. Minimize alcohol and caffeine. Eat small frequent meals with lean proteins and complex carbs. Avoid high and low blood sugars. Get adequate sleep, 7-8 hours a night. Needs exercise daily preferably in the morning.  

## 2014-05-17 NOTE — Assessment & Plan Note (Signed)
Uses Golytely routinely to manage symptoms. Avoid offending foods, start probiotics. Do not eat large meals in late evening and consider raising head of bed.

## 2014-07-21 ENCOUNTER — Encounter: Payer: Self-pay | Admitting: Family Medicine

## 2014-07-22 ENCOUNTER — Other Ambulatory Visit: Payer: Self-pay | Admitting: Family Medicine

## 2014-07-22 DIAGNOSIS — B0239 Other herpes zoster eye disease: Secondary | ICD-10-CM

## 2014-07-22 MED ORDER — GABAPENTIN 300 MG PO CAPS: 300.0000 mg | ORAL_CAPSULE | Freq: Two times a day (BID) | ORAL | Status: AC

## 2014-07-22 MED ORDER — ACYCLOVIR 800 MG PO TABS: 800.0000 mg | ORAL_TABLET | Freq: Every day | ORAL | Status: DC

## 2014-10-01 ENCOUNTER — Encounter: Payer: Self-pay | Admitting: Family Medicine

## 2014-11-06 ENCOUNTER — Ambulatory Visit (INDEPENDENT_AMBULATORY_CARE_PROVIDER_SITE_OTHER): Payer: 59 | Admitting: Physician Assistant

## 2014-11-06 ENCOUNTER — Encounter: Payer: Self-pay | Admitting: Physician Assistant

## 2014-11-06 VITALS — BP 132/75 | HR 56 | Temp 98.3°F | Resp 16 | Ht 63.5 in | Wt 118.2 lb

## 2014-11-06 DIAGNOSIS — J0100 Acute maxillary sinusitis, unspecified: Secondary | ICD-10-CM

## 2014-11-06 MED ORDER — HYDROCOD POLST-CHLORPHEN POLST 10-8 MG/5ML PO LQCR: 5.0000 mL | Freq: Two times a day (BID) | ORAL | Status: DC | PRN

## 2014-11-06 MED ORDER — AMOXICILLIN-POT CLAVULANATE 875-125 MG PO TABS: 1.0000 | ORAL_TABLET | Freq: Two times a day (BID) | ORAL | Status: DC

## 2014-11-06 NOTE — Progress Notes (Signed)
History of Present Illness: Norma Greene is a 64 y.o. female who present to the clinic today complaining of sinus pressure, sinus pain and nasal congestion x 2 weeks. Patient endorses fatigue, non productive cough and significant PND.  Patient denies chest pain or SOB.   History: Past Medical History  Diagnosis Date  . Allergy   . IBS (irritable bowel syndrome)     Dr. Kinnie ScalesMedoff  . Chicken pox as a child  . Measles as a child  . Asthma     allergic  . Allergic state 05/17/2014  . Insomnia 05/17/2014  . Headache 05/17/2014    Current outpatient prescriptions:  .  acyclovir (ZOVIRAX) 800 MG tablet, Take 1 tablet (800 mg total) by mouth 5 (five) times daily. Only fill if patient requests. (Patient taking differently: Take 800 mg by mouth 5 (five) times daily. Only fill if patient requests. PRN), Disp: 35 tablet, Rfl: 0 .  albuterol (PROVENTIL HFA;VENTOLIN HFA) 108 (90 BASE) MCG/ACT inhaler, Inhale into the lungs every 6 (six) hours as needed for wheezing or shortness of breath., Disp: , Rfl:  .  Calcium Carb-Cholecalciferol (CALCIUM + D3) 600-200 MG-UNIT TABS, Take by mouth., Disp: , Rfl:  .  dextromethorphan (DELSYM) 30 MG/5ML liquid, Take by mouth as needed for cough., Disp: , Rfl:  .  diphenhydrAMINE (SOMINEX) 25 MG tablet, Take 25 mg by mouth at bedtime as needed for sleep., Disp: , Rfl:  .  fexofenadine (ALLEGRA) 180 MG tablet, Take 180 mg by mouth daily as needed. , Disp: , Rfl:  .  gabapentin (NEURONTIN) 300 MG capsule, Take 1 capsule (300 mg total) by mouth 2 (two) times daily. (Patient taking differently: Take 300 mg by mouth 2 (two) times daily as needed. ), Disp: 60 capsule, Rfl: 0 .  guaiFENesin (MUCINEX) 600 MG 12 hr tablet, Take by mouth 2 (two) times daily., Disp: , Rfl:  .  ibuprofen (ADVIL,MOTRIN) 200 MG tablet, Take 200 mg by mouth every 6 (six) hours as needed., Disp: , Rfl:  .  PEG 3350-KCl-NaBcb-NaCl-NaSulf (GOLYTELY PO), Take by mouth 2 (two) times daily. 8 OUNCES  BID, Disp: , Rfl:  .  amoxicillin-clavulanate (AUGMENTIN) 875-125 MG per tablet, Take 1 tablet by mouth 2 (two) times daily., Disp: 20 tablet, Rfl: 0 .  chlorpheniramine-HYDROcodone (TUSSIONEX PENNKINETIC ER) 10-8 MG/5ML LQCR, Take 5 mLs by mouth every 12 (twelve) hours as needed., Disp: 115 mL, Rfl: 0 No Known Allergies Family History  Problem Relation Age of Onset  . Hypertension Father   . Hyperlipidemia Father   . Heart failure Father   . Atrial fibrillation Father   . Cancer Father     lung, soft tissue in hand, thigh, basal cell in ear- smoker then quit  . Heart attack Neg Hx   . Sudden death Neg Hx   . Atrial fibrillation Mother   . Dementia Mother   . Hypertension Son   . Cancer Maternal Grandmother     ? breast cancer- masectomy  . Heart disease Paternal Grandmother   . Heart disease Paternal Grandfather   . Depression Daughter    History   Social History  . Marital Status: Married    Spouse Name: N/A  . Number of Children: 5  . Years of Education: Nursing [employer]   Occupational History  .  [employer]   Social History Main Topics  . Smoking status: Never Smoker   . Smokeless tobacco: Never Used  . Alcohol Use: Yes  Comment: Wine on weekends  . Drug Use: No  . 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 None   Social History Narrative    Review of Systems: See HPI.  All other ROS are negative.  Physical Examination: BP 132/75 mmHg  Pulse 56  Temp(Src) 98.3 F (36.8 C) (Oral)  Resp 16  Ht 5' 3.5" (1.613 m)  Wt 118 lb 4 oz (53.638 kg)  BMI 20.62 kg/m2  SpO2 98%  General appearance: alert, cooperative and appears stated age Head: Normocephalic, without obvious abnormality, atraumatic, sinuses tender to percussion Eyes: conjunctivae/corneas clear. PERRL, EOM's intact. Fundi benign. Ears: normal TM's and external ear  canals both ears Nose: moderate congestion, turbinates swollen, sinus tenderness left Throat: lips, mucosa, and tongue normal; teeth and gums normal Neck: no adenopathy, no carotid bruit, no JVD, supple, symmetrical, trachea midline and thyroid not enlarged, symmetric, no tenderness/mass/nodules Lungs: clear to auscultation bilaterally Chest wall: no tenderness  Assessment/Plan: Acute maxillary sinusitis Rx Augmentin.  Increase fluids.  Rest.  Saline nasal spray.  Probiotic.  Mucinex as directed.  Humidifier in bedroom. Rx tussionex for cough.  Continue allergy medications.  Call or return to clinic if symptoms are not improving.

## 2014-11-06 NOTE — Patient Instructions (Signed)
Please take antibiotic as directed.  Increase fluid intake.  Use Saline nasal spray.  Take a daily multivitamin. Use Tussionex as directed for nighttime cough.  Continue Delsym of Mucinex-D for daytime cough.  Continue allergy medications.  Place a humidifier in the bedroom.  Please call or return clinic if symptoms are not improving.  Sinusitis Sinusitis is redness, soreness, and swelling (inflammation) of the paranasal sinuses. Paranasal sinuses are air pockets within the bones of your face (beneath the eyes, the middle of the forehead, or above the eyes). In healthy paranasal sinuses, mucus is able to drain out, and air is able to circulate through them by way of your nose. However, when your paranasal sinuses are inflamed, mucus and air can become trapped. This can allow bacteria and other germs to grow and cause infection. Sinusitis can develop quickly and last only a short time (acute) or continue over a long period (chronic). Sinusitis that lasts for more than 12 weeks is considered chronic.  CAUSES  Causes of sinusitis include:  Allergies.  Structural abnormalities, such as displacement of the cartilage that separates your nostrils (deviated septum), which can decrease the air flow through your nose and sinuses and affect sinus drainage.  Functional abnormalities, such as when the small hairs (cilia) that line your sinuses and help remove mucus do not work properly or are not present. SYMPTOMS  Symptoms of acute and chronic sinusitis are the same. The primary symptoms are pain and pressure around the affected sinuses. Other symptoms include:  Upper toothache.  Earache.  Headache.  Bad breath.  Decreased sense of smell and taste.  A cough, which worsens when you are lying flat.  Fatigue.  Fever.  Thick drainage from your nose, which often is green and may contain pus (purulent).  Swelling and warmth over the affected sinuses. DIAGNOSIS  Your caregiver will perform a  physical exam. During the exam, your caregiver may:  Look in your nose for signs of abnormal growths in your nostrils (nasal polyps).  Tap over the affected sinus to check for signs of infection.  View the inside of your sinuses (endoscopy) with a special imaging device with a light attached (endoscope), which is inserted into your sinuses. If your caregiver suspects that you have chronic sinusitis, one or more of the following tests may be recommended:  Allergy tests.  Nasal culture A sample of mucus is taken from your nose and sent to a lab and screened for bacteria.  Nasal cytology A sample of mucus is taken from your nose and examined by your caregiver to determine if your sinusitis is related to an allergy. TREATMENT  Most cases of acute sinusitis are related to a viral infection and will resolve on their own within 10 days. Sometimes medicines are prescribed to help relieve symptoms (pain medicine, decongestants, nasal steroid sprays, or saline sprays).  However, for sinusitis related to a bacterial infection, your caregiver will prescribe antibiotic medicines. These are medicines that will help kill the bacteria causing the infection.  Rarely, sinusitis is caused by a fungal infection. In theses cases, your caregiver will prescribe antifungal medicine. For some cases of chronic sinusitis, surgery is needed. Generally, these are cases in which sinusitis recurs more than 3 times per year, despite other treatments. HOME CARE INSTRUCTIONS   Drink plenty of water. Water helps thin the mucus so your sinuses can drain more easily.  Use a humidifier.  Inhale steam 3 to 4 times a day (for example, sit in the bathroom  with the shower running).  Apply a warm, moist washcloth to your face 3 to 4 times a day, or as directed by your caregiver.  Use saline nasal sprays to help moisten and clean your sinuses.  Take over-the-counter or prescription medicines for pain, discomfort, or fever only  as directed by your caregiver. SEEK IMMEDIATE MEDICAL CARE IF:  You have increasing pain or severe headaches.  You have nausea, vomiting, or drowsiness.  You have swelling around your face.  You have vision problems.  You have a stiff neck.  You have difficulty breathing. MAKE SURE YOU:   Understand these instructions.  Will watch your condition.  Will get help right away if you are not doing well or get worse. Document Released: 07/17/2005 Document Revised: 10/09/2011 Document Reviewed: 08/01/2011 [facility] Patient Information 2014 [place], [place].

## 2014-11-06 NOTE — Assessment & Plan Note (Signed)
Rx Augmentin.  Increase fluids.  Rest.  Saline nasal spray.  Probiotic.  Mucinex as directed.  Humidifier in bedroom. Rx tussionex for cough.  Continue allergy medications.  Call or return to clinic if symptoms are not improving.

## 2015-05-17 ENCOUNTER — Ambulatory Visit (INDEPENDENT_AMBULATORY_CARE_PROVIDER_SITE_OTHER): Payer: 59 | Admitting: Family Medicine

## 2015-05-17 ENCOUNTER — Encounter: Payer: Self-pay | Admitting: Family Medicine

## 2015-05-17 VITALS — BP 112/72 | HR 71 | Temp 98.1°F | Ht 63.5 in | Wt 116.0 lb

## 2015-05-17 DIAGNOSIS — Z0000 Encounter for general adult medical examination without abnormal findings: Secondary | ICD-10-CM | POA: Diagnosis not present

## 2015-05-17 DIAGNOSIS — Z1159 Encounter for screening for other viral diseases: Secondary | ICD-10-CM | POA: Diagnosis not present

## 2015-05-17 DIAGNOSIS — M8580 Other specified disorders of bone density and structure, unspecified site: Secondary | ICD-10-CM

## 2015-05-17 DIAGNOSIS — Z23 Encounter for immunization: Secondary | ICD-10-CM | POA: Diagnosis not present

## 2015-05-17 DIAGNOSIS — T7840XA Allergy, unspecified, initial encounter: Secondary | ICD-10-CM | POA: Diagnosis not present

## 2015-05-17 DIAGNOSIS — E785 Hyperlipidemia, unspecified: Secondary | ICD-10-CM | POA: Diagnosis not present

## 2015-05-17 DIAGNOSIS — R002 Palpitations: Secondary | ICD-10-CM

## 2015-05-17 DIAGNOSIS — K589 Irritable bowel syndrome without diarrhea: Secondary | ICD-10-CM

## 2015-05-17 HISTORY — DX: Palpitations: R00.2

## 2015-05-17 NOTE — Assessment & Plan Note (Signed)
GoLytely bid per Dr Kinnie ScalesMedoff, doing well on current regimen. Consider adding a probiotics

## 2015-05-17 NOTE — Progress Notes (Signed)
Pre visit review using our clinic review tool, if applicable. No additional management support is needed unless otherwise documented below in the visit note. 

## 2015-05-17 NOTE — Patient Instructions (Signed)
Probiotic daily [employer] or [place] 10 strain probiotic at [email or web address]   Preventive Care for Adults, Female A healthy lifestyle and preventive care can promote health and wellness. Preventive health guidelines for women include the following key practices.  A routine yearly physical is a good way to check with your health care provider about your health and preventive screening. It is a chance to share any concerns and updates on your health and to receive a thorough exam.  Visit your dentist for a routine exam and preventive care every 6 months. Brush your teeth twice a day and floss once a day. Good oral hygiene prevents tooth decay and gum disease.  The frequency of eye exams is based on your age, health, family medical history, use of contact lenses, and other factors. Follow your health care provider's recommendations for frequency of eye exams.  Eat a healthy diet. Foods like vegetables, fruits, whole grains, low-fat dairy products, and lean protein foods contain the nutrients you need without too many calories. Decrease your intake of foods high in solid fats, added sugars, and salt. Eat the right amount of calories for you.Get information about a proper diet from your health care provider, if necessary.  Regular physical exercise is one of the most important things you can do for your health. Most adults should get at least 150 minutes of moderate-intensity exercise (any activity that increases your heart rate and causes you to sweat) each week. In addition, most adults need muscle-strengthening exercises on 2 or more days a week.  Maintain a healthy weight. The body mass index (BMI) is a screening tool to identify possible weight problems. It provides an estimate of body fat based on height and weight. Your health care provider can find your BMI and can help you achieve or maintain a healthy weight.For adults 20 years and older:  A  BMI below 18.5 is considered underweight.  A BMI of 18.5 to 24.9 is normal.  A BMI of 25 to 29.9 is considered overweight.  A BMI of 30 and above is considered obese.  Maintain normal blood lipids and cholesterol levels by exercising and minimizing your intake of saturated fat. Eat a balanced diet with plenty of fruit and vegetables. Blood tests for lipids and cholesterol should begin at age 14 and be repeated every 5 years. If your lipid or cholesterol levels are high, you are over 50, or you are at high risk for heart disease, you may need your cholesterol levels checked more frequently.Ongoing high lipid and cholesterol levels should be treated with medicines if diet and exercise are not working.  If you smoke, find out from your health care provider how to quit. If you do not use tobacco, do not start.  Lung cancer screening is recommended for adults aged 42-80 years who are at high risk for developing lung cancer because of a history of smoking. A yearly low-dose CT scan of the lungs is recommended for people who have at least a 30-pack-year history of smoking and are a current smoker or have quit within the past 15 years. A pack year of smoking is smoking an average of 1 pack of cigarettes a day for 1 year (for example: 1 pack a day for 30 years or 2 packs a day for 15 years). Yearly screening should continue until the smoker has stopped smoking for at least 15 years. Yearly screening should be stopped for people who develop a health problem that would  prevent them from having lung cancer treatment.  If you are pregnant, do not drink alcohol. If you are breastfeeding, be very cautious about drinking alcohol. If you are not pregnant and choose to drink alcohol, do not have more than 1 drink per day. One drink is considered to be 12 ounces (355 mL) of beer, 5 ounces (148 mL) of wine, or 1.5 ounces (44 mL) of liquor.  Avoid use of street drugs. Do not share needles with anyone. Ask for help if  you need support or instructions about stopping the use of drugs.  High blood pressure causes heart disease and increases the risk of stroke. Your blood pressure should be checked at least every 1 to 2 years. Ongoing high blood pressure should be treated with medicines if weight loss and exercise do not work.  If you are 55-79 years old, ask your health care provider if you should take aspirin to prevent strokes.  Diabetes screening is done by taking a blood sample to check your blood glucose level after you have not eaten for a certain period of time (fasting). If you are not overweight and you do not have risk factors for diabetes, you should be screened once every 3 years starting at age 45. If you are overweight or obese and you are 40-70 years of age, you should be screened for diabetes every year as part of your cardiovascular risk assessment.  Breast cancer screening is essential preventive care for women. You should practice "breast self-awareness." This means understanding the normal appearance and feel of your breasts and may include breast self-examination. Any changes detected, no matter how small, should be reported to a health care provider. Women in their 20s and 30s should have a clinical breast exam (CBE) by a health care provider as part of a regular health exam every 1 to 3 years. After age 40, women should have a CBE every year. Starting at age 40, women should consider having a mammogram (breast X-ray test) every year. Women who have a family history of breast cancer should talk to their health care provider about genetic screening. Women at a high risk of breast cancer should talk to their health care providers about having an MRI and a mammogram every year.  Breast cancer gene (BRCA)-related cancer risk assessment is recommended for women who have family members with BRCA-related cancers. BRCA-related cancers include breast, ovarian, tubal, and peritoneal cancers. Having family  members with these cancers may be associated with an increased risk for harmful changes (mutations) in the breast cancer genes BRCA1 and BRCA2. Results of the assessment will determine the need for genetic counseling and BRCA1 and BRCA2 testing.  Your health care provider may recommend that you be screened regularly for cancer of the pelvic organs (ovaries, uterus, and vagina). This screening involves a pelvic examination, including checking for microscopic changes to the surface of your cervix (Pap test). You may be encouraged to have this screening done every 3 years, beginning at age 21.  For women ages 30-65, health care providers may recommend pelvic exams and Pap testing every 3 years, or they may recommend the Pap and pelvic exam, combined with testing for human papilloma virus (HPV), every 5 years. Some types of HPV increase your risk of cervical cancer. Testing for HPV may also be done on women of any age with unclear Pap test results.  Other health care providers may not recommend any screening for nonpregnant women who are considered low risk for pelvic cancer   and who do not have symptoms. Ask your health care provider if a screening pelvic exam is right for you.  If you have had past treatment for cervical cancer or a condition that could lead to cancer, you need Pap tests and screening for cancer for at least 20 years after your treatment. If Pap tests have been discontinued, your risk factors (such as having a new sexual partner) need to be reassessed to determine if screening should resume. Some women have medical problems that increase the chance of getting cervical cancer. In these cases, your health care provider may recommend more frequent screening and Pap tests.  Colorectal cancer can be detected and often prevented. Most routine colorectal cancer screening begins at the age of 5 years and continues through age 80 years. However, your health care provider may recommend screening at an  earlier age if you have risk factors for colon cancer. On a yearly basis, your health care provider may provide home test kits to check for hidden blood in the stool. Use of a small camera at the end of a tube, to directly examine the colon (sigmoidoscopy or colonoscopy), can detect the earliest forms of colorectal cancer. Talk to your health care provider about this at age 51, when routine screening begins. Direct exam of the colon should be repeated every 5-10 years through age 64 years, unless early forms of precancerous polyps or small growths are found.  People who are at an increased risk for hepatitis B should be screened for this virus. You are considered at high risk for hepatitis B if:  You were born in a country where hepatitis B occurs often. Talk with your health care provider about which countries are considered high risk.  Your parents were born in a high-risk country and you have not received a shot to protect against hepatitis B (hepatitis B vaccine).  You have HIV or AIDS.  You use needles to inject street drugs.  You live with, or have sex with, someone who has hepatitis B.  You get hemodialysis treatment.  You take certain medicines for conditions like cancer, organ transplantation, and autoimmune conditions.  Hepatitis C blood testing is recommended for all people born from [birth year] through 1965 and any individual with known risks for hepatitis C.  Practice safe sex. Use condoms and avoid high-risk sexual practices to reduce the spread of sexually transmitted infections (STIs). STIs include gonorrhea, chlamydia, syphilis, trichomonas, herpes, HPV, and human immunodeficiency virus (HIV). Herpes, HIV, and HPV are viral illnesses that have no cure. They can result in disability, cancer, and death.  You should be screened for sexually transmitted illnesses (STIs) including gonorrhea and chlamydia if:  You are sexually active and are younger than 24 years.  You are older than  24 years and your health care provider tells you that you are at risk for this type of infection.  Your sexual activity has changed since you were last screened and you are at an increased risk for chlamydia or gonorrhea. Ask your health care provider if you are at risk.  If you are at risk of being infected with HIV, it is recommended that you take a prescription medicine daily to prevent HIV infection. This is called preexposure prophylaxis (PrEP). You are considered at risk if:  You are sexually active and do not regularly use condoms or know the HIV status of your partner(s).  You take drugs by injection.  You are sexually active with a partner who has HIV.  Talk with your health care provider about whether you are at high risk of being infected with HIV. If you choose to begin PrEP, you should first be tested for HIV. You should then be tested every 3 months for as long as you are taking PrEP.  Osteoporosis is a disease in which the bones lose minerals and strength with aging. This can result in serious bone fractures or breaks. The risk of osteoporosis can be identified using a bone density scan. Women ages 65 years and over and women at risk for fractures or osteoporosis should discuss screening with their health care providers. Ask your health care provider whether you should take a calcium supplement or vitamin D to reduce the rate of osteoporosis.  Menopause can be associated with physical symptoms and risks. Hormone replacement therapy is available to decrease symptoms and risks. You should talk to your health care provider about whether hormone replacement therapy is right for you.  Use sunscreen. Apply sunscreen liberally and repeatedly throughout the day. You should seek shade when your shadow is shorter than you. Protect yourself by wearing long sleeves, pants, a wide-brimmed hat, and sunglasses year round, whenever you are outdoors.  Once a month, do a whole body skin exam, using  a mirror to look at the skin on your back. Tell your health care provider of new moles, moles that have irregular borders, moles that are larger than a pencil eraser, or moles that have changed in shape or color.  Stay current with required vaccines (immunizations).  Influenza vaccine. All adults should be immunized every year.  Tetanus, diphtheria, and acellular pertussis (Td, Tdap) vaccine. Pregnant women should receive 1 dose of Tdap vaccine during each pregnancy. The dose should be obtained regardless of the length of time since the last dose. Immunization is preferred during the 27th-36th week of gestation. An adult who has not previously received Tdap or who does not know her vaccine status should receive 1 dose of Tdap. This initial dose should be followed by tetanus and diphtheria toxoids (Td) booster doses every 10 years. Adults with an unknown or incomplete history of completing a 3-dose immunization series with Td-containing vaccines should begin or complete a primary immunization series including a Tdap dose. Adults should receive a Td booster every 10 years.  Varicella vaccine. An adult without evidence of immunity to varicella should receive 2 doses or a second dose if she has previously received 1 dose. Pregnant females who do not have evidence of immunity should receive the first dose after pregnancy. This first dose should be obtained before leaving the health care facility. The second dose should be obtained 4-8 weeks after the first dose.  Human papillomavirus (HPV) vaccine. Females aged 13-26 years who have not received the vaccine previously should obtain the 3-dose series. The vaccine is not recommended for use in pregnant females. However, pregnancy testing is not needed before receiving a dose. If a female is found to be pregnant after receiving a dose, no treatment is needed. In that case, the remaining doses should be delayed until after the pregnancy. Immunization is recommended  for any person with an immunocompromised condition through the age of 26 years if she did not get any or all doses earlier. During the 3-dose series, the second dose should be obtained 4-8 weeks after the first dose. The third dose should be obtained 24 weeks after the first dose and 16 weeks after the second dose.  Zoster vaccine. One dose is recommended for adults   aged 34 years or older unless certain conditions are present.  Measles, mumps, and rubella (MMR) vaccine. Adults born before [birth year] generally are considered immune to measles and mumps. Adults born in [birth year] or later should have 1 or more doses of MMR vaccine unless there is a contraindication to the vaccine or there is laboratory evidence of immunity to each of the three diseases. A routine second dose of MMR vaccine should be obtained at least 28 days after the first dose for students attending postsecondary schools, health care workers, or international travelers. People who received inactivated measles vaccine or an unknown type of measles vaccine during 1963-1967 should receive 2 doses of MMR vaccine. People who received inactivated mumps vaccine or an unknown type of mumps vaccine before 1979 and are at high risk for mumps infection should consider immunization with 2 doses of MMR vaccine. For females of childbearing age, rubella immunity should be determined. If there is no evidence of immunity, females who are not pregnant should be vaccinated. If there is no evidence of immunity, females who are pregnant should delay immunization until after pregnancy. Unvaccinated health care workers born before [birth year] who lack laboratory evidence of measles, mumps, or rubella immunity or laboratory confirmation of disease should consider measles and mumps immunization with 2 doses of MMR vaccine or rubella immunization with 1 dose of MMR vaccine.  Pneumococcal 13-valent conjugate (PCV13) vaccine. When indicated, a person who is uncertain of his immunization  history and has no record of immunization should receive the PCV13 vaccine. All adults 107 years of age and older should receive this vaccine. An adult aged 77 years or older who has certain medical conditions and has not been previously immunized should receive 1 dose of PCV13 vaccine. This PCV13 should be followed with a dose of pneumococcal polysaccharide (PPSV23) vaccine. Adults who are at high risk for pneumococcal disease should obtain the PPSV23 vaccine at least 8 weeks after the dose of PCV13 vaccine. Adults older than 64 years of age who have normal immune system function should obtain the PPSV23 vaccine dose at least 1 year after the dose of PCV13 vaccine.  Pneumococcal polysaccharide (PPSV23) vaccine. When PCV13 is also indicated, PCV13 should be obtained first. All adults aged 61 years and older should be immunized. An adult younger than age 15 years who has certain medical conditions should be immunized. Any person who resides in a nursing home or long-term care facility should be immunized. An adult smoker should be immunized. People with an immunocompromised condition and certain other conditions should receive both PCV13 and PPSV23 vaccines. People with human immunodeficiency virus (HIV) infection should be immunized as soon as possible after diagnosis. Immunization during chemotherapy or radiation therapy should be avoided. Routine use of PPSV23 vaccine is not recommended for American Indians, [place]atives, or people younger than 65 years unless there are medical conditions that require PPSV23 vaccine. When indicated, people who have unknown immunization and have no record of immunization should receive PPSV23 vaccine. One-time revaccination 5 years after the first dose of PPSV23 is recommended for people aged 19-64 years who have chronic kidney failure, nephrotic syndrome, asplenia, or immunocompromised conditions. People who received 1-2 doses of PPSV23 before age 16 years should receive  another dose of PPSV23 vaccine at age 29 years or later if at least 5 years have passed since the previous dose. Doses of PPSV23 are not needed for people immunized with PPSV23 at or after age 68 years.  Meningococcal vaccine. Adults with asplenia or  persistent complement component deficiencies should receive 2 doses of quadrivalent meningococcal conjugate (MenACWY-D) vaccine. The doses should be obtained at least 2 months apart. Microbiologists working with certain meningococcal bacteria, military recruits, people at risk during an outbreak, and people who travel to or live in countries with a high rate of meningitis should be immunized. A first-year college student up through age 21 years who is living in a residence hall should receive a dose if she did not receive a dose on or after her 16th birthday. Adults who have certain high-risk conditions should receive one or more doses of vaccine.  Hepatitis A vaccine. Adults who wish to be protected from this disease, have certain high-risk conditions, work with hepatitis A-infected animals, work in hepatitis A research labs, or travel to or work in countries with a high rate of hepatitis A should be immunized. Adults who were previously unvaccinated and who anticipate close contact with an international adoptee during the first 60 days after arrival in the [place] from a country with a high rate of hepatitis A should be immunized.  Hepatitis B vaccine. Adults who wish to be protected from this disease, have certain high-risk conditions, may be exposed to blood or other infectious body fluids, are household contacts or sex partners of hepatitis B positive people, are clients or workers in certain care facilities, or travel to or work in countries with a high rate of hepatitis B should be immunized.  Haemophilus influenzae type b (Hib) vaccine. A previously unvaccinated person with asplenia or sickle cell disease or having a scheduled splenectomy should  receive 1 dose of Hib vaccine. Regardless of previous immunization, a recipient of a hematopoietic stem cell transplant should receive a 3-dose series 6-12 months after her successful transplant. Hib vaccine is not recommended for adults with HIV infection. Preventive Services / Frequency Ages 19 to 39 years  Blood pressure check.** / Every 3-5 years.  Lipid and cholesterol check.** / Every 5 years beginning at age 20.  Clinical breast exam.** / Every 3 years for women in their 20s and 30s.  BRCA-related cancer risk assessment.** / For women who have family members with a BRCA-related cancer (breast, ovarian, tubal, or peritoneal cancers).  Pap test.** / Every 2 years from ages 21 through 29. Every 3 years starting at age 30 through age 65 or 70 with a history of 3 consecutive normal Pap tests.  HPV screening.** / Every 3 years from ages 30 through ages 65 to 70 with a history of 3 consecutive normal Pap tests.  Hepatitis C blood test.** / For any individual with known risks for hepatitis C.  Skin self-exam. / Monthly.  Influenza vaccine. / Every year.  Tetanus, diphtheria, and acellular pertussis (Tdap, Td) vaccine.** / Consult your health care provider. Pregnant women should receive 1 dose of Tdap vaccine during each pregnancy. 1 dose of Td every 10 years.  Varicella vaccine.** / Consult your health care provider. Pregnant females who do not have evidence of immunity should receive the first dose after pregnancy.  HPV vaccine. / 3 doses over 6 months, if 26 and younger. The vaccine is not recommended for use in pregnant females. However, pregnancy testing is not needed before receiving a dose.  Measles, mumps, rubella (MMR) vaccine.** / You need at least 1 dose of MMR if you were born in 1957 or later. You may also need a 2nd dose. For females of childbearing age, rubella immunity should be determined. If there is no evidence of   immunity, females who are not pregnant should be  vaccinated. If there is no evidence of immunity, females who are pregnant should delay immunization until after pregnancy.  Pneumococcal 13-valent conjugate (PCV13) vaccine.** / Consult your health care provider.  Pneumococcal polysaccharide (PPSV23) vaccine.** / 1 to 2 doses if you smoke cigarettes or if you have certain conditions.  Meningococcal vaccine.** / 1 dose if you are age 19 to 21 years and a first-year college student living in a residence hall, or have one of several medical conditions, you need to get vaccinated against meningococcal disease. You may also need additional booster doses.  Hepatitis A vaccine.** / Consult your health care provider.  Hepatitis B vaccine.** / Consult your health care provider.  Haemophilus influenzae type b (Hib) vaccine.** / Consult your health care provider. Ages 40 to 64 years  Blood pressure check.** / Every year.  Lipid and cholesterol check.** / Every 5 years beginning at age 20 years.  Lung cancer screening. / Every year if you are aged 55-80 years and have a 30-pack-year history of smoking and currently smoke or have quit within the past 15 years. Yearly screening is stopped once you have quit smoking for at least 15 years or develop a health problem that would prevent you from having lung cancer treatment.  Clinical breast exam.** / Every year after age 40 years.  BRCA-related cancer risk assessment.** / For women who have family members with a BRCA-related cancer (breast, ovarian, tubal, or peritoneal cancers).  Mammogram.** / Every year beginning at age 40 years and continuing for as long as you are in good health. Consult with your health care provider.  Pap test.** / Every 3 years starting at age 30 years through age 65 or 70 years with a history of 3 consecutive normal Pap tests.  HPV screening.** / Every 3 years from ages 30 years through ages 65 to 70 years with a history of 3 consecutive normal Pap tests.  Fecal occult blood  test (FOBT) of stool. / Every year beginning at age 50 years and continuing until age 75 years. You may not need to do this test if you get a colonoscopy every 10 years.  Flexible sigmoidoscopy or colonoscopy.** / Every 5 years for a flexible sigmoidoscopy or every 10 years for a colonoscopy beginning at age 50 years and continuing until age 75 years.  Hepatitis C blood test.** / For all people born from 1945 through 1965 and any individual with known risks for hepatitis C.  Skin self-exam. / Monthly.  Influenza vaccine. / Every year.  Tetanus, diphtheria, and acellular pertussis (Tdap/Td) vaccine.** / Consult your health care provider. Pregnant women should receive 1 dose of Tdap vaccine during each pregnancy. 1 dose of Td every 10 years.  Varicella vaccine.** / Consult your health care provider. Pregnant females who do not have evidence of immunity should receive the first dose after pregnancy.  Zoster vaccine.** / 1 dose for adults aged 60 years or older.  Measles, mumps, rubella (MMR) vaccine.** / You need at least 1 dose of MMR if you were born in 1957 or later. You may also need a second dose. For females of childbearing age, rubella immunity should be determined. If there is no evidence of immunity, females who are not pregnant should be vaccinated. If there is no evidence of immunity, females who are pregnant should delay immunization until after pregnancy.  Pneumococcal 13-valent conjugate (PCV13) vaccine.** / Consult your health care provider.  Pneumococcal polysaccharide (PPSV23) vaccine.** /   1 to 2 doses if you smoke cigarettes or if you have certain conditions.  Meningococcal vaccine.** / Consult your health care provider.  Hepatitis A vaccine.** / Consult your health care provider.  Hepatitis B vaccine.** / Consult your health care provider.  Haemophilus influenzae type b (Hib) vaccine.** / Consult your health care provider. Ages 65 years and over  Blood pressure check.**  / Every year.  Lipid and cholesterol check.** / Every 5 years beginning at age 20 years.  Lung cancer screening. / Every year if you are aged 55-80 years and have a 30-pack-year history of smoking and currently smoke or have quit within the past 15 years. Yearly screening is stopped once you have quit smoking for at least 15 years or develop a health problem that would prevent you from having lung cancer treatment.  Clinical breast exam.** / Every year after age 40 years.  BRCA-related cancer risk assessment.** / For women who have family members with a BRCA-related cancer (breast, ovarian, tubal, or peritoneal cancers).  Mammogram.** / Every year beginning at age 40 years and continuing for as long as you are in good health. Consult with your health care provider.  Pap test.** / Every 3 years starting at age 30 years through age 65 or 70 years with 3 consecutive normal Pap tests. Testing can be stopped between 65 and 70 years with 3 consecutive normal Pap tests and no abnormal Pap or HPV tests in the past 10 years.  HPV screening.** / Every 3 years from ages 30 years through ages 65 or 70 years with a history of 3 consecutive normal Pap tests. Testing can be stopped between 65 and 70 years with 3 consecutive normal Pap tests and no abnormal Pap or HPV tests in the past 10 years.  Fecal occult blood test (FOBT) of stool. / Every year beginning at age 50 years and continuing until age 75 years. You may not need to do this test if you get a colonoscopy every 10 years.  Flexible sigmoidoscopy or colonoscopy.** / Every 5 years for a flexible sigmoidoscopy or every 10 years for a colonoscopy beginning at age 50 years and continuing until age 75 years.  Hepatitis C blood test.** / For all people born from 1945 through 1965 and any individual with known risks for hepatitis C.  Osteoporosis screening.** / A one-time screening for women ages 65 years and over and women at risk for fractures or  osteoporosis.  Skin self-exam. / Monthly.  Influenza vaccine. / Every year.  Tetanus, diphtheria, and acellular pertussis (Tdap/Td) vaccine.** / 1 dose of Td every 10 years.  Varicella vaccine.** / Consult your health care provider.  Zoster vaccine.** / 1 dose for adults aged 60 years or older.  Pneumococcal 13-valent conjugate (PCV13) vaccine.** / Consult your health care provider.  Pneumococcal polysaccharide (PPSV23) vaccine.** / 1 dose for all adults aged 65 years and older.  Meningococcal vaccine.** / Consult your health care provider.  Hepatitis A vaccine.** / Consult your health care provider.  Hepatitis B vaccine.** / Consult your health care provider.  Haemophilus influenzae type b (Hib) vaccine.** / Consult your health care provider. ** Family history and personal history of risk and conditions may change your health care provider's recommendations.   This information is not intended to replace advice given to you by your health care provider. Make sure you discuss any questions you have with your health care provider.   Document Released: 09/12/2001 Document Revised: 08/07/2014 Document Reviewed: 12/12/2010 Elsevier   Interactive Patient Education [employer].

## 2015-05-17 NOTE — Assessment & Plan Note (Signed)
Rare, fleeting will continue monitor

## 2015-05-17 NOTE — Progress Notes (Signed)
Subjective:    Patient ID: Norma Greene, female    DOB: 1950-12-12, 64 y.o.   MRN: [id number]  Chief Complaint  Patient presents with  . Annual Exam    HPI Patient is in today for annual exam. Has been struggling with nasal congestion and pruritus. No fevers or chills. Has been having some intermittent headaches with congestion. No photophobia or phonophobia. Follows with gastroenterology Dr. Kinnie Scales last colonoscopy was in February 2016. Has previously been seen by Dr. Marcelle Overlie of GYN. Tries to maintain a heart healthy diet, regular exercise. Denies CP/palp/SOB/HA/congestion/fevers/GI or GU c/o. Taking meds as prescribed  Past Medical History  Diagnosis Date  . Allergy   . IBS (irritable bowel syndrome)     Dr. Kinnie Scales  . Chicken pox as a child  . Measles as a child  . Asthma     allergic  . Allergic state 05/17/2014  . Insomnia 05/17/2014  . Headache 05/17/2014  . Palpitations 05/17/2015  . History of chicken pox   . Preventative health care 05/23/2015  . Hyperlipidemia, mild 05/23/2015    Past Surgical History  Procedure Laterality Date  . Mandible surgery  1985 and 1996  . Wisdom tooth extraction  1972  . Dilation and curettage of uterus  1983    missed AB  . Breast surgery      b/l lumpectomy    Family History  Problem Relation Age of Onset  . Hypertension Father   . Hyperlipidemia Father   . Heart failure Father   . Atrial fibrillation Father   . Cancer Father     lung, soft tissue in hand, thigh, basal cell in ear- smoker then quit  . Heart attack Neg Hx   . Sudden death Neg Hx   . Atrial fibrillation Mother   . Dementia Mother   . Hypertension Son   . Cancer Maternal Grandmother     ? breast cancer- masectomy  . Heart disease Paternal Grandmother   . Heart disease Paternal Grandfather   . Depression Daughter     Social History   Social History  . Marital Status: Married    Spouse Name: N/A  . Number of Children: 5  . Years of Education:  Nursing [place]   Occupational History  .  [employer]   Social History Main Topics  . Smoking status: Never Smoker   . Smokeless tobacco: Never Used  . Alcohol Use: Yes     Comment: Wine on weekends  . Drug Use: No  . 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 Not on file   Social History Narrative    Outpatient Prescriptions Prior to Visit  Medication Sig Dispense Refill  . acyclovir (ZOVIRAX) 800 MG tablet Take 1 tablet (800 mg total) by mouth 5 (five) times daily. Only fill if patient requests. (Patient not taking: Reported on 05/17/2015) 35 tablet 0  . albuterol (PROVENTIL HFA;VENTOLIN HFA) 108 (90 BASE) MCG/ACT inhaler Inhale into the lungs every 6 (six) hours as needed for wheezing or shortness of breath.    . Calcium Carb-Cholecalciferol (CALCIUM + D3) 600-200 MG-UNIT TABS Take by mouth.    . diphenhydrAMINE (SOMINEX) 25 MG tablet Take 25 mg by mouth at bedtime as needed for sleep.    . fexofenadine (ALLEGRA) 180 MG tablet Take 180 mg by mouth daily  as needed.     . gabapentin (NEURONTIN) 300 MG capsule Take 1 capsule (300 mg total) by mouth 2 (two) times daily. (Patient not taking: Reported on 05/17/2015) 60 capsule 0  . ibuprofen (ADVIL,MOTRIN) 200 MG tablet Take 200 mg by mouth every 6 (six) hours as needed.    Marland Kitchen PEG 3350-KCl-NaBcb-NaCl-NaSulf (GOLYTELY PO) Take by mouth 2 (two) times daily. 8 OUNCES BID    . amoxicillin-clavulanate (AUGMENTIN) 875-125 MG per tablet Take 1 tablet by mouth 2 (two) times daily. 20 tablet 0  . chlorpheniramine-HYDROcodone (TUSSIONEX PENNKINETIC ER) 10-8 MG/5ML LQCR Take 5 mLs by mouth every 12 (twelve) hours as needed. 115 mL 0  . dextromethorphan (DELSYM) 30 MG/5ML liquid Take by mouth as needed for cough.    Marland Kitchen guaiFENesin (MUCINEX) 600 MG 12 hr tablet Take by mouth 2 (two) times daily.     No  facility-administered medications prior to visit.    No Known Allergies  Review of Systems  Constitutional: Negative for fever and malaise/fatigue.  HENT: Positive for congestion.   Eyes: Negative for discharge.  Respiratory: Negative for shortness of breath.   Cardiovascular: Negative for chest pain, palpitations and leg swelling.  Gastrointestinal: Negative for nausea and abdominal pain.  Genitourinary: Negative for dysuria.  Musculoskeletal: Negative for falls.  Skin: Negative for rash.  Neurological: Positive for headaches. Negative for loss of consciousness.  Endo/Heme/Allergies: Negative for environmental allergies.  Psychiatric/Behavioral: Negative for depression. The patient is not nervous/anxious.        Objective:    Physical Exam  Constitutional: She is oriented to person, place, and time. She appears well-developed and well-nourished. No distress.  HENT:  Head: Normocephalic and atraumatic.  Nose: Nose normal.  Eyes: Conjunctivae are normal. Right eye exhibits no discharge. Left eye exhibits no discharge.  Neck: Normal range of motion. Neck supple. No thyromegaly present.  Cardiovascular: Normal rate, regular rhythm and normal heart sounds.   No murmur heard. Pulmonary/Chest: Effort normal and breath sounds normal. No respiratory distress.  Abdominal: Soft. Bowel sounds are normal. She exhibits no distension and no mass. There is no tenderness.  Musculoskeletal: She exhibits no edema.  Lymphadenopathy:    She has no cervical adenopathy.  Neurological: She is alert and oriented to person, place, and time.  Skin: Skin is warm and dry.  Psychiatric: She has a normal mood and affect. Her behavior is normal.  Nursing note and vitals reviewed.   BP 112/72 mmHg  Pulse 71  Temp(Src) 98.1 F (36.7 C) (Oral)  Ht 5' 3.5" (1.613 m)  Wt 116 lb (52.617 kg)  BMI 20.22 kg/m2  SpO2 96% Wt Readings from Last 3 Encounters:  05/17/15 116 lb (52.617 kg)  11/06/14 118 lb 4  oz (53.638 kg)  05/12/14 115 lb 3.2 oz (52.254 kg)     Lab Results  Component Value Date   WBC 5.5 05/17/2015   HGB 12.8 05/17/2015   HCT 38.4 05/17/2015   PLT 271.0 05/17/2015   GLUCOSE 91 05/17/2015   CHOL 203* 05/17/2015   TRIG 71.0 05/17/2015   HDL 90.70 05/17/2015   LDLCALC 98 05/17/2015   ALT 17 05/17/2015   AST 19 05/17/2015   NA 140 05/17/2015   K 3.9 05/17/2015   CL 103 05/17/2015   CREATININE 0.96 05/17/2015   BUN 19 05/17/2015   CO2 31 05/17/2015   TSH 0.82 05/17/2015    Lab Results  Component Value Date   TSH 0.82 05/17/2015   Lab Results  Component Value  Date   WBC 5.5 05/17/2015   HGB 12.8 05/17/2015   HCT 38.4 05/17/2015   MCV 91.3 05/17/2015   PLT 271.0 05/17/2015   Lab Results  Component Value Date   NA 140 05/17/2015   K 3.9 05/17/2015   CO2 31 05/17/2015   GLUCOSE 91 05/17/2015   BUN 19 05/17/2015   CREATININE 0.96 05/17/2015   BILITOT 0.6 05/17/2015   ALKPHOS 55 05/17/2015   AST 19 05/17/2015   ALT 17 05/17/2015   PROT 7.2 05/17/2015   ALBUMIN 4.5 05/17/2015   CALCIUM 9.7 05/17/2015   GFR 62.20 05/17/2015   Lab Results  Component Value Date   CHOL 203* 05/17/2015   Lab Results  Component Value Date   HDL 90.70 05/17/2015   Lab Results  Component Value Date   LDLCALC 98 05/17/2015   Lab Results  Component Value Date   TRIG 71.0 05/17/2015   Lab Results  Component Value Date   CHOLHDL 2 05/17/2015   No results found for: HGBA1C     Assessment & Plan:   Problem List Items Addressed This Visit    Preventative health care - Primary    Patient encouraged to maintain heart healthy diet, regular exercise, adequate sleep. Consider daily probiotics. Take medications as prescribed. Given and reviewed [place] [facility] [place] [facility] [place] [place] [facility] [place] and encouraged to complete and return      Relevant Orders   TSH (Completed)   CBC (Completed)   Lipid panel (Completed)   Vit D  25 hydroxy (rtn osteoporosis monitoring)  (Completed)   Comprehensive metabolic panel (Completed)   Hepatitis C Antibody (Completed)   Palpitations    Rare, fleeting will continue monitor      IBS (irritable bowel syndrome)    GoLytely bid per Dr Kinnie ScalesMedoff, doing well on current regimen. Consider adding a probiotics      Hyperlipidemia, mild    Encouraged heart healthy diet, increase exercise, avoid trans fats, consider a krill oil cap daily      Allergic state    Daily antihistamines and add Flonase       Other Visit Diagnoses    Osteopenia        Relevant Orders    TSH (Completed)    CBC (Completed)    Lipid panel (Completed)    Vit D  25 hydroxy (rtn osteoporosis monitoring) (Completed)    Comprehensive metabolic panel (Completed)    Need for hepatitis C screening test        Relevant Orders    Hepatitis C Antibody (Completed)    Need for zoster vaccination        Relevant Orders    Varicella-zoster vaccine subcutaneous (Completed)       I have discontinued Ms. Spahr's dextromethorphan, guaiFENesin, amoxicillin-clavulanate, and chlorpheniramine-HYDROcodone. I am also having her maintain her fexofenadine, PEG 3350-KCl-NaBcb-NaCl-NaSulf (GOLYTELY PO), ibuprofen, diphenhydrAMINE, Calcium + D3, gabapentin, acyclovir, albuterol, and multivitamin.  Meds ordered this encounter  Medications  . Multiple Vitamin (MULTIVITAMIN) tablet    Sig: Take 1 tablet by mouth daily.     Danise EdgeBLYTH, STACEY, MD

## 2015-05-18 LAB — COMPREHENSIVE METABOLIC PANEL
ALK PHOS: 55 U/L (ref 39–117)
ALT: 17 U/L (ref 0–35)
AST: 19 U/L (ref 0–37)
Albumin: 4.5 g/dL (ref 3.5–5.2)
BUN: 19 mg/dL (ref 6–23)
CALCIUM: 9.7 mg/dL (ref 8.4–10.5)
CO2: 31 meq/L (ref 19–32)
CREATININE: 0.96 mg/dL (ref 0.40–1.20)
Chloride: 103 mEq/L (ref 96–112)
GFR: 62.2 mL/min (ref >60.00)
Glucose, Bld: 91 mg/dL (ref 70–99)
Potassium: 3.9 mEq/L (ref 3.5–5.1)
Sodium: 140 mEq/L (ref 135–145)
TOTAL PROTEIN: 7.2 g/dL (ref 6.0–8.3)
Total Bilirubin: 0.6 mg/dL (ref 0.2–1.2)

## 2015-05-18 LAB — CBC
HCT: 38.4 % (ref 36.0–46.0)
HEMOGLOBIN: 12.8 g/dL (ref 12.0–15.0)
MCHC: 33.2 g/dL (ref 30.0–36.0)
MCV: 91.3 fl (ref 78.0–100.0)
Platelets: 271 10*3/uL (ref 150.0–400.0)
RBC: 4.21 Mil/uL (ref 3.87–5.11)
RDW: 13.2 % (ref 11.5–15.5)
WBC: 5.5 10*3/uL (ref 4.0–10.5)

## 2015-05-18 LAB — HEPATITIS C ANTIBODY: HCV AB: NEGATIVE

## 2015-05-18 LAB — LIPID PANEL
Cholesterol: 203 mg/dL — ABNORMAL HIGH (ref 0–200)
HDL: 90.7 mg/dL (ref >39.00)
LDL Cholesterol: 98 mg/dL (ref 0–99)
NONHDL: 112.62
Total CHOL/HDL Ratio: 2
Triglycerides: 71 mg/dL (ref 0.0–149.0)
VLDL: 14.2 mg/dL (ref 0.0–40.0)

## 2015-05-18 LAB — VITAMIN D 25 HYDROXY (VIT D DEFICIENCY, FRACTURES): VITD: 61.59 ng/mL (ref 30.00–100.00)

## 2015-05-18 LAB — TSH: TSH: 0.82 u[IU]/mL (ref 0.35–4.50)

## 2015-05-23 ENCOUNTER — Encounter: Payer: Self-pay | Admitting: Family Medicine

## 2015-05-23 DIAGNOSIS — Z0000 Encounter for general adult medical examination without abnormal findings: Secondary | ICD-10-CM | POA: Insufficient documentation

## 2015-05-23 DIAGNOSIS — E785 Hyperlipidemia, unspecified: Secondary | ICD-10-CM | POA: Insufficient documentation

## 2015-05-23 HISTORY — DX: Hyperlipidemia, unspecified: E78.5

## 2015-05-23 NOTE — Assessment & Plan Note (Signed)
Patient encouraged to maintain heart healthy diet, regular exercise, adequate sleep. Consider daily probiotics. Take medications as prescribed. Given and reviewed copy of ACP documents from [place] Secretary of State and encouraged to complete and return 

## 2015-05-23 NOTE — Assessment & Plan Note (Signed)
Daily antihistamines and add Flonase

## 2015-05-23 NOTE — Assessment & Plan Note (Signed)
Encouraged heart healthy diet, increase exercise, avoid trans fats, consider a krill oil cap daily 

## 2015-09-23 MED FILL — GAVILYTE-G SOLUTION: 236 | 84 days supply | Qty: 48000 | Fill #0

## 2015-11-05 DIAGNOSIS — K5900 Constipation, unspecified: Secondary | ICD-10-CM | POA: Diagnosis not present

## 2015-11-05 MED FILL — POLYETHYLENE GLYCOL 3350 PO: 90 days supply | Qty: 3162 | Fill #0

## 2015-12-20 MED FILL — GAVILYTE-G SOLUTION: 236 | 84 days supply | Qty: 48000 | Fill #1

## 2015-12-22 DIAGNOSIS — H40011 Open angle with borderline findings, low risk, right eye: Secondary | ICD-10-CM | POA: Diagnosis not present

## 2015-12-22 DIAGNOSIS — H01029 Squamous blepharitis unspecified eye, unspecified eyelid: Secondary | ICD-10-CM | POA: Diagnosis not present

## 2016-03-17 MED FILL — GAVILYTE-G SOLUTION: 236 | 84 days supply | Qty: 48000 | Fill #2

## 2016-05-19 ENCOUNTER — Encounter: Payer: 59 | Admitting: Family Medicine

## 2016-05-22 ENCOUNTER — Encounter: Payer: Self-pay | Admitting: Family Medicine

## 2016-05-22 ENCOUNTER — Ambulatory Visit (INDEPENDENT_AMBULATORY_CARE_PROVIDER_SITE_OTHER): Payer: 59 | Admitting: Family Medicine

## 2016-05-22 VITALS — BP 118/72 | HR 63 | Temp 98.2°F | Ht 64.0 in | Wt 120.5 lb

## 2016-05-22 DIAGNOSIS — T7840XS Allergy, unspecified, sequela: Secondary | ICD-10-CM

## 2016-05-22 DIAGNOSIS — E785 Hyperlipidemia, unspecified: Secondary | ICD-10-CM | POA: Diagnosis not present

## 2016-05-22 DIAGNOSIS — R05 Cough: Secondary | ICD-10-CM

## 2016-05-22 DIAGNOSIS — Z0000 Encounter for general adult medical examination without abnormal findings: Secondary | ICD-10-CM

## 2016-05-22 DIAGNOSIS — R059 Cough, unspecified: Secondary | ICD-10-CM | POA: Insufficient documentation

## 2016-05-22 LAB — COMPREHENSIVE METABOLIC PANEL
ALBUMIN: 4.6 g/dL (ref 3.5–5.2)
ALK PHOS: 60 U/L (ref 39–117)
ALT: 21 U/L (ref 0–35)
AST: 22 U/L (ref 0–37)
BUN: 16 mg/dL (ref 6–23)
CHLORIDE: 104 meq/L (ref 96–112)
CO2: 31 mEq/L (ref 19–32)
CREATININE: 0.87 mg/dL (ref 0.40–1.20)
Calcium: 9.7 mg/dL (ref 8.4–10.5)
GFR: 69.46 mL/min (ref >60.00)
GLUCOSE: 98 mg/dL (ref 70–99)
Potassium: 4 mEq/L (ref 3.5–5.1)
SODIUM: 141 meq/L (ref 135–145)
TOTAL PROTEIN: 7.1 g/dL (ref 6.0–8.3)
Total Bilirubin: 0.6 mg/dL (ref 0.2–1.2)

## 2016-05-22 LAB — LIPID PANEL
CHOLESTEROL: 194 mg/dL (ref 0–200)
HDL: 82.9 mg/dL (ref >39.00)
LDL CALC: 94 mg/dL (ref 0–99)
NONHDL: 111.24
Total CHOL/HDL Ratio: 2
Triglycerides: 88 mg/dL (ref 0.0–149.0)
VLDL: 17.6 mg/dL (ref 0.0–40.0)

## 2016-05-22 LAB — CBC
HCT: 38.9 % (ref 36.0–46.0)
Hemoglobin: 13.2 g/dL (ref 12.0–15.0)
MCHC: 33.9 g/dL (ref 30.0–36.0)
MCV: 89.6 fl (ref 78.0–100.0)
Platelets: 295 10*3/uL (ref 150.0–400.0)
RBC: 4.34 Mil/uL (ref 3.87–5.11)
RDW: 13.3 % (ref 11.5–15.5)
WBC: 5.3 10*3/uL (ref 4.0–10.5)

## 2016-05-22 LAB — HIV ANTIBODY (ROUTINE TESTING W REFLEX): HIV: NONREACTIVE

## 2016-05-22 LAB — TSH: TSH: 0.82 u[IU]/mL (ref 0.35–4.50)

## 2016-05-22 MED ORDER — MONTELUKAST SODIUM 10 MG PO TABS: 10.0000 mg | ORAL_TABLET | Freq: Every evening | ORAL | 3 refills | Status: DC | PRN

## 2016-05-22 MED ORDER — HYDROCODONE-HOMATROPINE 5-1.5 MG/5ML PO SYRP: 5.0000 mL | ORAL_SOLUTION | Freq: Three times a day (TID) | ORAL | 0 refills | Status: DC | PRN

## 2016-05-22 MED FILL — HYDROCODONE-HOMATROPINE SOL: 5-1.5 | 11 days supply | Qty: 160 | Fill #0

## 2016-05-22 MED FILL — MONTELUKAST SOD 10 MG TAB: 10 | 30 days supply | Qty: 30 | Fill #0

## 2016-05-22 NOTE — Assessment & Plan Note (Signed)
She believes it is allergic and during the day is tolerable but qhs is keeping her up some. allowed Hydromet ot use prn for severe cough

## 2016-05-22 NOTE — Patient Instructions (Signed)
Probiotic daily, [employer], 1 cap daily at [facility] Aged/Black garlic Vitamin C 366 to 1000 mg daily Elderberry liquid Zinc , Coldeeze or Xicam Preventive Care for Adults, Female A healthy lifestyle and preventive care can promote health and wellness. Preventive health guidelines for women include the following key practices.  A routine yearly physical is a good way to check with your health care provider about your health and preventive screening. It is a chance to share any concerns and updates on your health and to receive a thorough exam.  Visit your dentist for a routine exam and preventive care every 6 months. Brush your teeth twice a day and floss once a day. Good oral hygiene prevents tooth decay and gum disease.  The frequency of eye exams is based on your age, health, family medical history, use of contact lenses, and other factors. Follow your health care provider's recommendations for frequency of eye exams.  Eat a healthy diet. Foods like vegetables, fruits, whole grains, low-fat dairy products, and lean protein foods contain the nutrients you need without too many calories. Decrease your intake of foods high in solid fats, added sugars, and salt. Eat the right amount of calories for you.Get information about a proper diet from your health care provider, if necessary.  Regular physical exercise is one of the most important things you can do for your health. Most adults should get at least 150 minutes of moderate-intensity exercise (any activity that increases your heart rate and causes you to sweat) each week. In addition, most adults need muscle-strengthening exercises on 2 or more days a week.  Maintain a healthy weight. The body mass index (BMI) is a screening tool to identify possible weight problems. It provides an estimate of body fat based on height and weight. Your health care provider can find your BMI and can help you achieve or maintain a healthy weight.For adults 20 years  and older:  A BMI below 18.5 is considered underweight.  A BMI of 18.5 to 24.9 is normal.  A BMI of 25 to 29.9 is considered overweight.  A BMI of 30 and above is considered obese.  Maintain normal blood lipids and cholesterol levels by exercising and minimizing your intake of saturated fat. Eat a balanced diet with plenty of fruit and vegetables. Blood tests for lipids and cholesterol should begin at age 84 and be repeated every 5 years. If your lipid or cholesterol levels are high, you are over 50, or you are at high risk for heart disease, you may need your cholesterol levels checked more frequently.Ongoing high lipid and cholesterol levels should be treated with medicines if diet and exercise are not working.  If you smoke, find out from your health care provider how to quit. If you do not use tobacco, do not start.  Lung cancer screening is recommended for adults aged 74-80 years who are at high risk for developing lung cancer because of a history of smoking. A yearly low-dose CT scan of the lungs is recommended for people who have at least a 30-pack-year history of smoking and are a current smoker or have quit within the past 15 years. A pack year of smoking is smoking an average of 1 pack of cigarettes a day for 1 year (for example: 1 pack a day for 30 years or 2 packs a day for 15 years). Yearly screening should continue until the smoker has stopped smoking for at least 15 years. Yearly screening should be stopped for people who  develop a health problem that would prevent them from having lung cancer treatment.  If you are pregnant, do not drink alcohol. If you are breastfeeding, be very cautious about drinking alcohol. If you are not pregnant and choose to drink alcohol, do not have more than 1 drink per day. One drink is considered to be 12 ounces (355 mL) of beer, 5 ounces (148 mL) of wine, or 1.5 ounces (44 mL) of liquor.  Avoid use of street drugs. Do not share needles with anyone.  Ask for help if you need support or instructions about stopping the use of drugs.  High blood pressure causes heart disease and increases the risk of stroke. Your blood pressure should be checked at least every 1 to 2 years. Ongoing high blood pressure should be treated with medicines if weight loss and exercise do not work.  If you are 52-66 years old, ask your health care provider if you should take aspirin to prevent strokes.  Diabetes screening is done by taking a blood sample to check your blood glucose level after you have not eaten for a certain period of time (fasting). If you are not overweight and you do not have risk factors for diabetes, you should be screened once every 3 years starting at age 60. If you are overweight or obese and you are 24-76 years of age, you should be screened for diabetes every year as part of your cardiovascular risk assessment.  Breast cancer screening is essential preventive care for women. You should practice "breast self-awareness." This means understanding the normal appearance and feel of your breasts and may include breast self-examination. Any changes detected, no matter how small, should be reported to a health care provider. Women in their 80s and 30s should have a clinical breast exam (CBE) by a health care provider as part of a regular health exam every 1 to 3 years. After age 94, women should have a CBE every year. Starting at age 76, women should consider having a mammogram (breast X-ray test) every year. Women who have a family history of breast cancer should talk to their health care provider about genetic screening. Women at a high risk of breast cancer should talk to their health care providers about having an MRI and a mammogram every year.  Breast cancer gene (BRCA)-related cancer risk assessment is recommended for women who have family members with BRCA-related cancers. BRCA-related cancers include breast, ovarian, tubal, and peritoneal cancers.  Having family members with these cancers may be associated with an increased risk for harmful changes (mutations) in the breast cancer genes BRCA1 and BRCA2. Results of the assessment will determine the need for genetic counseling and BRCA1 and BRCA2 testing.  Your health care provider may recommend that you be screened regularly for cancer of the pelvic organs (ovaries, uterus, and vagina). This screening involves a pelvic examination, including checking for microscopic changes to the surface of your cervix (Pap test). You may be encouraged to have this screening done every 3 years, beginning at age 66.  For women ages 52-65, health care providers may recommend pelvic exams and Pap testing every 3 years, or they may recommend the Pap and pelvic exam, combined with testing for human papilloma virus (HPV), every 5 years. Some types of HPV increase your risk of cervical cancer. Testing for HPV may also be done on women of any age with unclear Pap test results.  Other health care providers may not recommend any screening for nonpregnant women who are  considered low risk for pelvic cancer and who do not have symptoms. Ask your health care provider if a screening pelvic exam is right for you.  If you have had past treatment for cervical cancer or a condition that could lead to cancer, you need Pap tests and screening for cancer for at least 20 years after your treatment. If Pap tests have been discontinued, your risk factors (such as having a new sexual partner) need to be reassessed to determine if screening should resume. Some women have medical problems that increase the chance of getting cervical cancer. In these cases, your health care provider may recommend more frequent screening and Pap tests.  Colorectal cancer can be detected and often prevented. Most routine colorectal cancer screening begins at the age of 64 years and continues through age 2 years. However, your health care provider may recommend  screening at an earlier age if you have risk factors for colon cancer. On a yearly basis, your health care provider may provide home test kits to check for hidden blood in the stool. Use of a small camera at the end of a tube, to directly examine the colon (sigmoidoscopy or colonoscopy), can detect the earliest forms of colorectal cancer. Talk to your health care provider about this at age 19, when routine screening begins. Direct exam of the colon should be repeated every 5-10 years through age 80 years, unless early forms of precancerous polyps or small growths are found.  People who are at an increased risk for hepatitis B should be screened for this virus. You are considered at high risk for hepatitis B if:  You were born in a country where hepatitis B occurs often. Talk with your health care provider about which countries are considered high risk.  Your parents were born in a high-risk country and you have not received a shot to protect against hepatitis B (hepatitis B vaccine).  You have HIV or AIDS.  You use needles to inject street drugs.  You live with, or have sex with, someone who has hepatitis B.  You get hemodialysis treatment.  You take certain medicines for conditions like cancer, organ transplantation, and autoimmune conditions.  Hepatitis C blood testing is recommended for all people born from [birth year] through 1965 and any individual with known risks for hepatitis C.  Practice safe sex. Use condoms and avoid high-risk sexual practices to reduce the spread of sexually transmitted infections (STIs). STIs include gonorrhea, chlamydia, syphilis, trichomonas, herpes, HPV, and human immunodeficiency virus (HIV). Herpes, HIV, and HPV are viral illnesses that have no cure. They can result in disability, cancer, and death.  You should be screened for sexually transmitted illnesses (STIs) including gonorrhea and chlamydia if:  You are sexually active and are younger than 24 years.  You  are older than 24 years and your health care provider tells you that you are at risk for this type of infection.  Your sexual activity has changed since you were last screened and you are at an increased risk for chlamydia or gonorrhea. Ask your health care provider if you are at risk.  If you are at risk of being infected with HIV, it is recommended that you take a prescription medicine daily to prevent HIV infection. This is called preexposure prophylaxis (PrEP). You are considered at risk if:  You are sexually active and do not regularly use condoms or know the HIV status of your partner(s).  You take drugs by injection.  You are sexually active with  a partner who has HIV.  Talk with your health care provider about whether you are at high risk of being infected with HIV. If you choose to begin PrEP, you should first be tested for HIV. You should then be tested every 3 months for as long as you are taking PrEP.  Osteoporosis is a disease in which the bones lose minerals and strength with aging. This can result in serious bone fractures or breaks. The risk of osteoporosis can be identified using a bone density scan. Women ages 83 years and over and women at risk for fractures or osteoporosis should discuss screening with their health care providers. Ask your health care provider whether you should take a calcium supplement or vitamin D to reduce the rate of osteoporosis.  Menopause can be associated with physical symptoms and risks. Hormone replacement therapy is available to decrease symptoms and risks. You should talk to your health care provider about whether hormone replacement therapy is right for you.  Use sunscreen. Apply sunscreen liberally and repeatedly throughout the day. You should seek shade when your shadow is shorter than you. Protect yourself by wearing long sleeves, pants, a wide-brimmed hat, and sunglasses year round, whenever you are outdoors.  Once a month, do a whole body  skin exam, using a mirror to look at the skin on your back. Tell your health care provider of new moles, moles that have irregular borders, moles that are larger than a pencil eraser, or moles that have changed in shape or color.  Stay current with required vaccines (immunizations).  Influenza vaccine. All adults should be immunized every year.  Tetanus, diphtheria, and acellular pertussis (Td, Tdap) vaccine. Pregnant women should receive 1 dose of Tdap vaccine during each pregnancy. The dose should be obtained regardless of the length of time since the last dose. Immunization is preferred during the 27th-36th week of gestation. An adult who has not previously received Tdap or who does not know her vaccine status should receive 1 dose of Tdap. This initial dose should be followed by tetanus and diphtheria toxoids (Td) booster doses every 10 years. Adults with an unknown or incomplete history of completing a 3-dose immunization series with Td-containing vaccines should begin or complete a primary immunization series including a Tdap dose. Adults should receive a Td booster every 10 years.  Varicella vaccine. An adult without evidence of immunity to varicella should receive 2 doses or a second dose if she has previously received 1 dose. Pregnant females who do not have evidence of immunity should receive the first dose after pregnancy. This first dose should be obtained before leaving the health care facility. The second dose should be obtained 4-8 weeks after the first dose.  Human papillomavirus (HPV) vaccine. Females aged 13-26 years who have not received the vaccine previously should obtain the 3-dose series. The vaccine is not recommended for use in pregnant females. However, pregnancy testing is not needed before receiving a dose. If a female is found to be pregnant after receiving a dose, no treatment is needed. In that case, the remaining doses should be delayed until after the pregnancy.  Immunization is recommended for any person with an immunocompromised condition through the age of 46 years if she did not get any or all doses earlier. During the 3-dose series, the second dose should be obtained 4-8 weeks after the first dose. The third dose should be obtained 24 weeks after the first dose and 16 weeks after the second dose.  Zoster vaccine.  One dose is recommended for adults aged 41 years or older unless certain conditions are present.  Measles, mumps, and rubella (MMR) vaccine. Adults born before [birth year] generally are considered immune to measles and mumps. Adults born in [birth year] or later should have 1 or more doses of MMR vaccine unless there is a contraindication to the vaccine or there is laboratory evidence of immunity to each of the three diseases. A routine second dose of MMR vaccine should be obtained at least 28 days after the first dose for students attending postsecondary schools, health care workers, or international travelers. People who received inactivated measles vaccine or an unknown type of measles vaccine during 1963-1967 should receive 2 doses of MMR vaccine. People who received inactivated mumps vaccine or an unknown type of mumps vaccine before 1979 and are at high risk for mumps infection should consider immunization with 2 doses of MMR vaccine. For females of childbearing age, rubella immunity should be determined. If there is no evidence of immunity, females who are not pregnant should be vaccinated. If there is no evidence of immunity, females who are pregnant should delay immunization until after pregnancy. Unvaccinated health care workers born before [birth year] who lack laboratory evidence of measles, mumps, or rubella immunity or laboratory confirmation of disease should consider measles and mumps immunization with 2 doses of MMR vaccine or rubella immunization with 1 dose of MMR vaccine.  Pneumococcal 13-valent conjugate (PCV13) vaccine. When indicated, a person who is  uncertain of his immunization history and has no record of immunization should receive the PCV13 vaccine. All adults 60 years of age and older should receive this vaccine. An adult aged 67 years or older who has certain medical conditions and has not been previously immunized should receive 1 dose of PCV13 vaccine. This PCV13 should be followed with a dose of pneumococcal polysaccharide (PPSV23) vaccine. Adults who are at high risk for pneumococcal disease should obtain the PPSV23 vaccine at least 8 weeks after the dose of PCV13 vaccine. Adults older than 65 years of age who have normal immune system function should obtain the PPSV23 vaccine dose at least 1 year after the dose of PCV13 vaccine.  Pneumococcal polysaccharide (PPSV23) vaccine. When PCV13 is also indicated, PCV13 should be obtained first. All adults aged 35 years and older should be immunized. An adult younger than age 38 years who has certain medical conditions should be immunized. Any person who resides in a nursing home or long-term care facility should be immunized. An adult smoker should be immunized. People with an immunocompromised condition and certain other conditions should receive both PCV13 and PPSV23 vaccines. People with human immunodeficiency virus (HIV) infection should be immunized as soon as possible after diagnosis. Immunization during chemotherapy or radiation therapy should be avoided. Routine use of PPSV23 vaccine is not recommended for American Indians, [place] Natives, or people younger than 65 years unless there are medical conditions that require PPSV23 vaccine. When indicated, people who have unknown immunization and have no record of immunization should receive PPSV23 vaccine. One-time revaccination 5 years after the first dose of PPSV23 is recommended for people aged 19-64 years who have chronic kidney failure, nephrotic syndrome, asplenia, or immunocompromised conditions. People who received 1-2 doses of PPSV23 before age  32 years should receive another dose of PPSV23 vaccine at age 57 years or later if at least 5 years have passed since the previous dose. Doses of PPSV23 are not needed for people immunized with PPSV23 at or after age 12 years.  Meningococcal vaccine. Adults with asplenia or persistent complement component deficiencies should receive 2 doses of quadrivalent meningococcal conjugate (MenACWY-D) vaccine. The doses should be obtained at least 2 months apart. Microbiologists working with certain meningococcal bacteria, [employer] recruits, people at risk during an outbreak, and people who travel to or live in countries with a high rate of meningitis should be immunized. A first-year college student up through age 51 years who is living in a residence hall should receive a dose if she did not receive a dose on or after her 16th birthday. Adults who have certain high-risk conditions should receive one or more doses of vaccine.  Hepatitis A vaccine. Adults who wish to be protected from this disease, have certain high-risk conditions, work with hepatitis A-infected animals, work in hepatitis A research labs, or travel to or work in countries with a high rate of hepatitis A should be immunized. Adults who were previously unvaccinated and who anticipate close contact with an international adoptee during the first 60 days after arrival in the [place] from a country with a high rate of hepatitis A should be immunized.  Hepatitis B vaccine. Adults who wish to be protected from this disease, have certain high-risk conditions, may be exposed to blood or other infectious body fluids, are household contacts or sex partners of hepatitis B positive people, are clients or workers in certain care facilities, or travel to or work in countries with a high rate of hepatitis B should be immunized.  Haemophilus influenzae type b (Hib) vaccine. A previously unvaccinated person with asplenia or sickle cell disease or having a  scheduled splenectomy should receive 1 dose of Hib vaccine. Regardless of previous immunization, a recipient of a hematopoietic stem cell transplant should receive a 3-dose series 6-12 months after her successful transplant. Hib vaccine is not recommended for adults with HIV infection. Preventive Services / Frequency Ages 53 to 62 years  Blood pressure check.** / Every 3-5 years.  Lipid and cholesterol check.** / Every 5 years beginning at age 71.  Clinical breast exam.** / Every 3 years for women in their 7s and 42s.  BRCA-related cancer risk assessment.** / For women who have family members with a BRCA-related cancer (breast, ovarian, tubal, or peritoneal cancers).  Pap test.** / Every 2 years from ages 6 through 56. Every 3 years starting at age 29 through age 64 or 79 with a history of 3 consecutive normal Pap tests.  HPV screening.** / Every 3 years from ages 66 through ages 23 to 40 with a history of 3 consecutive normal Pap tests.  Hepatitis C blood test.** / For any individual with known risks for hepatitis C.  Skin self-exam. / Monthly.  Influenza vaccine. / Every year.  Tetanus, diphtheria, and acellular pertussis (Tdap, Td) vaccine.** / Consult your health care provider. Pregnant women should receive 1 dose of Tdap vaccine during each pregnancy. 1 dose of Td every 10 years.  Varicella vaccine.** / Consult your health care provider. Pregnant females who do not have evidence of immunity should receive the first dose after pregnancy.  HPV vaccine. / 3 doses over 6 months, if 16 and younger. The vaccine is not recommended for use in pregnant females. However, pregnancy testing is not needed before receiving a dose.  Measles, mumps, rubella (MMR) vaccine.** / You need at least 1 dose of MMR if you were born in 1957 or later. You may also need a 2nd dose. For females of childbearing age, rubella immunity should be determined.  If there is no evidence of immunity, females who are not  pregnant should be vaccinated. If there is no evidence of immunity, females who are pregnant should delay immunization until after pregnancy.  Pneumococcal 13-valent conjugate (PCV13) vaccine.** / Consult your health care provider.  Pneumococcal polysaccharide (PPSV23) vaccine.** / 1 to 2 doses if you smoke cigarettes or if you have certain conditions.  Meningococcal vaccine.** / 1 dose if you are age 46 to 66 years and a Market researcher living in a residence hall, or have one of several medical conditions, you need to get vaccinated against meningococcal disease. You may also need additional booster doses.  Hepatitis A vaccine.** / Consult your health care provider.  Hepatitis B vaccine.** / Consult your health care provider.  Haemophilus influenzae type b (Hib) vaccine.** / Consult your health care provider. Ages 89 to 85 years  Blood pressure check.** / Every year.  Lipid and cholesterol check.** / Every 5 years beginning at age 30 years.  Lung cancer screening. / Every year if you are aged 9-80 years and have a 30-pack-year history of smoking and currently smoke or have quit within the past 15 years. Yearly screening is stopped once you have quit smoking for at least 15 years or develop a health problem that would prevent you from having lung cancer treatment.  Clinical breast exam.** / Every year after age 52 years.  BRCA-related cancer risk assessment.** / For women who have family members with a BRCA-related cancer (breast, ovarian, tubal, or peritoneal cancers).  Mammogram.** / Every year beginning at age 33 years and continuing for as long as you are in good health. Consult with your health care provider.  Pap test.** / Every 3 years starting at age 74 years through age 81 or 3 years with a history of 3 consecutive normal Pap tests.  HPV screening.** / Every 3 years from ages 64 years through ages 55 to 12 years with a history of 3 consecutive normal Pap  tests.  Fecal occult blood test (FOBT) of stool. / Every year beginning at age 72 years and continuing until age 38 years. You may not need to do this test if you get a colonoscopy every 10 years.  Flexible sigmoidoscopy or colonoscopy.** / Every 5 years for a flexible sigmoidoscopy or every 10 years for a colonoscopy beginning at age 40 years and continuing until age 77 years.  Hepatitis C blood test.** / For all people born from [birth year] through 1965 and any individual with known risks for hepatitis C.  Skin self-exam. / Monthly.  Influenza vaccine. / Every year.  Tetanus, diphtheria, and acellular pertussis (Tdap/Td) vaccine.** / Consult your health care provider. Pregnant women should receive 1 dose of Tdap vaccine during each pregnancy. 1 dose of Td every 10 years.  Varicella vaccine.** / Consult your health care provider. Pregnant females who do not have evidence of immunity should receive the first dose after pregnancy.  Zoster vaccine.** / 1 dose for adults aged 38 years or older.  Measles, mumps, rubella (MMR) vaccine.** / You need at least 1 dose of MMR if you were born in 1957 or later. You may also need a second dose. For females of childbearing age, rubella immunity should be determined. If there is no evidence of immunity, females who are not pregnant should be vaccinated. If there is no evidence of immunity, females who are pregnant should delay immunization until after pregnancy.  Pneumococcal 13-valent conjugate (PCV13) vaccine.** / Consult your health care  provider.  Pneumococcal polysaccharide (PPSV23) vaccine.** / 1 to 2 doses if you smoke cigarettes or if you have certain conditions.  Meningococcal vaccine.** / Consult your health care provider.  Hepatitis A vaccine.** / Consult your health care provider.  Hepatitis B vaccine.** / Consult your health care provider.  Haemophilus influenzae type b (Hib) vaccine.** / Consult your health care provider. Ages 43 years and  over  Blood pressure check.** / Every year.  Lipid and cholesterol check.** / Every 5 years beginning at age 6 years.  Lung cancer screening. / Every year if you are aged 34-80 years and have a 30-pack-year history of smoking and currently smoke or have quit within the past 15 years. Yearly screening is stopped once you have quit smoking for at least 15 years or develop a health problem that would prevent you from having lung cancer treatment.  Clinical breast exam.** / Every year after age 61 years.  BRCA-related cancer risk assessment.** / For women who have family members with a BRCA-related cancer (breast, ovarian, tubal, or peritoneal cancers).  Mammogram.** / Every year beginning at age 39 years and continuing for as long as you are in good health. Consult with your health care provider.  Pap test.** / Every 3 years starting at age 57 years through age 9 or 20 years with 3 consecutive normal Pap tests. Testing can be stopped between 65 and 70 years with 3 consecutive normal Pap tests and no abnormal Pap or HPV tests in the past 10 years.  HPV screening.** / Every 3 years from ages 58 years through ages 62 or 35 years with a history of 3 consecutive normal Pap tests. Testing can be stopped between 65 and 70 years with 3 consecutive normal Pap tests and no abnormal Pap or HPV tests in the past 10 years.  Fecal occult blood test (FOBT) of stool. / Every year beginning at age 64 years and continuing until age 59 years. You may not need to do this test if you get a colonoscopy every 10 years.  Flexible sigmoidoscopy or colonoscopy.** / Every 5 years for a flexible sigmoidoscopy or every 10 years for a colonoscopy beginning at age 28 years and continuing until age 83 years.  Hepatitis C blood test.** / For all people born from [birth year] through 1965 and any individual with known risks for hepatitis C.  Osteoporosis screening.** / A one-time screening for women ages 87 years and over and women  at risk for fractures or osteoporosis.  Skin self-exam. / Monthly.  Influenza vaccine. / Every year.  Tetanus, diphtheria, and acellular pertussis (Tdap/Td) vaccine.** / 1 dose of Td every 10 years.  Varicella vaccine.** / Consult your health care provider.  Zoster vaccine.** / 1 dose for adults aged 35 years or older.  Pneumococcal 13-valent conjugate (PCV13) vaccine.** / Consult your health care provider.  Pneumococcal polysaccharide (PPSV23) vaccine.** / 1 dose for all adults aged 75 years and older.  Meningococcal vaccine.** / Consult your health care provider.  Hepatitis A vaccine.** / Consult your health care provider.  Hepatitis B vaccine.** / Consult your health care provider.  Haemophilus influenzae type b (Hib) vaccine.** / Consult your health care provider. ** Family history and personal history of risk and conditions may change your health care provider's recommendations.   This information is not intended to replace advice given to you by your health care provider. Make sure you discuss any questions you have with your health care provider.   Document Released: 09/12/2001  Document Revised: 08/07/2014 Document Reviewed: 12/12/2010 Elsevier Interactive Patient Education [employer].

## 2016-05-22 NOTE — Assessment & Plan Note (Signed)
Will add Singulair to current meds to use prn

## 2016-05-22 NOTE — Assessment & Plan Note (Signed)
Patient encouraged to maintain heart healthy diet, regular exercise, adequate sleep. Consider daily probiotics. Take medications as prescribed. Patient reports she has advanced directives. She agrees to bring Norma Greene a copy

## 2016-05-22 NOTE — Progress Notes (Signed)
Patient ID: Norma Greene, female   DOB: 11/23/1950, 65 y.o.   MRN: [id number]   Subjective:    Patient ID: Norma Greene, female    DOB: 08/09/1950, 65 y.o.   MRN: [id number]  Chief Complaint  Patient presents with  . Annual Exam    HPI Patient is in today for annual preventative exam and follow up on chronic medical problems. Prior to his past couple of weeks she had been feeling well but is now struggling with a cough. She believes it is allergic and during the day is tolerable but qhs is keeping her up. No signs of acute illness. Denies CP/palp/HA/congestion/fevers/GI or GU c/o. Taking meds as prescribed  Past Medical History:  Diagnosis Date  . Allergic state 05/17/2014  . Allergy   . Asthma    allergic  . Chicken pox as a child  . Headache 05/17/2014  . History of chicken pox   . Hyperlipidemia, mild 05/23/2015  . IBS (irritable bowel syndrome)    Dr. Kinnie ScalesMedoff  . Insomnia 05/17/2014  . Measles as a child  . Palpitations 05/17/2015  . Preventative health care 05/23/2015    Past Surgical History:  Procedure Laterality Date  . BREAST SURGERY     b/l lumpectomy  . DILATION AND CURETTAGE OF UTERUS  1983   missed AB  . MANDIBLE SURGERY  1985 and 1996  . WISDOM TOOTH EXTRACTION  1972    Family History  Problem Relation Age of Onset  . Hypertension Father   . Hyperlipidemia Father   . Heart failure Father   . Atrial fibrillation Father   . Cancer Father     lung, soft tissue in hand, thigh, basal cell in ear- smoker then quit  . Heart attack Neg Hx   . Sudden death Neg Hx   . Atrial fibrillation Mother   . Dementia Mother   . Hypertension Son   . Cancer Maternal Grandmother     ? breast cancer- masectomy  . Heart disease Paternal Grandmother   . Heart disease Paternal Grandfather   . Depression Daughter     Social History   Social History  . Marital status: Married    Spouse name: N/A  . Number of children: 5  . Years of education: Nursing [place]    Occupational History  .  [employer]   Social History Main Topics  . Smoking status: Never Smoker  . Smokeless tobacco: Never Used  . Alcohol use Yes     Comment: Wine on weekends  . Drug use: No  . 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 Not on file   Social History Narrative  . No narrative on file    Outpatient Medications Prior to Visit  Medication Sig Dispense Refill  . acyclovir (ZOVIRAX) 800 MG tablet Take 1 tablet (800 mg total) by mouth 5 (five) times daily. Only fill if patient requests. 35 tablet 0  . albuterol (PROVENTIL HFA;VENTOLIN HFA) 108 (90 BASE) MCG/ACT inhaler Inhale into the lungs every 6 (six) hours as needed for wheezing or shortness of breath.    . Calcium Carb-Cholecalciferol (CALCIUM + D3) 600-200 MG-UNIT TABS Take by mouth.    . diphenhydrAMINE (SOMINEX) 25 MG tablet Take 25 mg by mouth at bedtime as needed for sleep.    . fexofenadine (ALLEGRA)  180 MG tablet Take 180 mg by mouth daily as needed.     Marland Kitchen ibuprofen (ADVIL,MOTRIN) 200 MG tablet Take 200 mg by mouth every 6 (six) hours as needed.    . Multiple Vitamin (MULTIVITAMIN) tablet Take 1 tablet by mouth daily.    Marland Kitchen PEG 3350-KCl-NaBcb-NaCl-NaSulf (GOLYTELY PO) Take by mouth 2 (two) times daily. 8 OUNCES BID     No facility-administered medications prior to visit.     No Known Allergies  Review of Systems  Constitutional: Negative for fever and malaise/fatigue.  HENT: Positive for congestion.   Eyes: Negative for blurred vision.  Respiratory: Positive for cough. Negative for shortness of breath.   Cardiovascular: Negative for chest pain, palpitations and leg swelling.  Gastrointestinal: Negative for abdominal pain, blood in stool and nausea.  Genitourinary: Negative for dysuria and frequency.  Musculoskeletal: Negative for falls.  Skin: Negative for  rash.  Neurological: Negative for dizziness, loss of consciousness and headaches.  Endo/Heme/Allergies: Negative for environmental allergies.  Psychiatric/Behavioral: Negative for depression. The patient is not nervous/anxious.        Objective:    Physical Exam  Constitutional: She is oriented to person, place, and time. She appears well-developed and well-nourished. No distress.  HENT:  Head: Normocephalic and atraumatic.  Eyes: Conjunctivae are normal.  Neck: Neck supple. No thyromegaly present.  Cardiovascular: Normal rate, regular rhythm and normal heart sounds.   No murmur heard. Pulmonary/Chest: Effort normal and breath sounds normal. No respiratory distress.  Abdominal: Soft. Bowel sounds are normal. She exhibits no distension and no mass. There is no tenderness.  Musculoskeletal: She exhibits no edema.  Lymphadenopathy:    She has no cervical adenopathy.  Neurological: She is alert and oriented to person, place, and time.  Skin: Skin is warm and dry.  Psychiatric: She has a normal mood and affect. Her behavior is normal.    BP 118/72 (BP Location: Left Arm, Patient Position: Sitting, Cuff Size: Normal)   Pulse 63   Temp 98.2 F (36.8 C) (Oral)   Ht 5\' 4"  (1.626 m)   Wt 120 lb 8 oz (54.7 kg)   SpO2 97%   BMI 20.68 kg/m  Wt Readings from Last 3 Encounters:  05/22/16 120 lb 8 oz (54.7 kg)  05/17/15 116 lb (52.6 kg)  11/06/14 118 lb 4 oz (53.6 kg)     Lab Results  Component Value Date   WBC 5.3 05/22/2016   HGB 13.2 05/22/2016   HCT 38.9 05/22/2016   PLT 295.0 05/22/2016   GLUCOSE 98 05/22/2016   CHOL 194 05/22/2016   TRIG 88.0 05/22/2016   HDL 82.90 05/22/2016   LDLCALC 94 05/22/2016   ALT 21 05/22/2016   AST 22 05/22/2016   NA 141 05/22/2016   K 4.0 05/22/2016   CL 104 05/22/2016   CREATININE 0.87 05/22/2016   BUN 16 05/22/2016   CO2 31 05/22/2016   TSH 0.82 05/22/2016    Lab Results  Component Value Date   TSH 0.82 05/22/2016   Lab Results   Component Value Date   WBC 5.3 05/22/2016   HGB 13.2 05/22/2016   HCT 38.9 05/22/2016   MCV 89.6 05/22/2016   PLT 295.0 05/22/2016   Lab Results  Component Value Date   NA 141 05/22/2016   K 4.0 05/22/2016   CO2 31 05/22/2016   GLUCOSE 98 05/22/2016   BUN 16 05/22/2016   CREATININE 0.87 05/22/2016   BILITOT 0.6 05/22/2016   ALKPHOS 60 05/22/2016   AST  22 05/22/2016   ALT 21 05/22/2016   PROT 7.1 05/22/2016   ALBUMIN 4.6 05/22/2016   CALCIUM 9.7 05/22/2016   GFR 69.46 05/22/2016   Lab Results  Component Value Date   CHOL 194 05/22/2016   Lab Results  Component Value Date   HDL 82.90 05/22/2016   Lab Results  Component Value Date   LDLCALC 94 05/22/2016   Lab Results  Component Value Date   TRIG 88.0 05/22/2016   Lab Results  Component Value Date   CHOLHDL 2 05/22/2016   No results found for: HGBA1C     Assessment & Plan:   Problem List Items Addressed This Visit    Allergic state    Will add Singulair to current meds to use prn      Preventative health care - Primary    Patient encouraged to maintain heart healthy diet, regular exercise, adequate sleep. Consider daily probiotics. Take medications as prescribed. Patient reports she has advanced directives. She agrees to bring [place] a copy      Relevant Orders   TSH (Completed)   CBC (Completed)   Comprehensive metabolic panel (Completed)   HIV antibody (with reflex)   Hyperlipidemia, mild    Encouraged heart healthy diet, increase exercise, avoid trans fats, consider a krill oil cap daily      Relevant Orders   Lipid panel (Completed)   Cough    She believes it is allergic and during the day is tolerable but qhs is keeping her up some. allowed Hydromet ot use prn for severe cough      Relevant Orders   TSH (Completed)   Comprehensive metabolic panel (Completed)    Other Visit Diagnoses   None.     I have discontinued Ms. Crossman's PEG 3350-KCl-NaBcb-NaCl-NaSulf (GOLYTELY PO). I am also  having her start on montelukast and HYDROcodone-homatropine. Additionally, I am having her maintain her fexofenadine, ibuprofen, diphenhydrAMINE, Calcium + D3, acyclovir, albuterol, and multivitamin.  Meds ordered this encounter  Medications  . montelukast (SINGULAIR) 10 MG tablet    Sig: Take 1 tablet (10 mg total) by mouth at bedtime as needed.    Dispense:  30 tablet    Refill:  3  . HYDROcodone-homatropine (HYCODAN) 5-1.5 MG/5ML syrup    Sig: Take 5 mLs by mouth every 8 (eight) hours as needed for cough.    Dispense:  160 mL    Refill:  0     Danise Edge, MD

## 2016-05-22 NOTE — Progress Notes (Signed)
Pre visit review using our clinic review tool, if applicable. No additional management support is needed unless otherwise documented below in the visit note. 

## 2016-05-22 NOTE — Assessment & Plan Note (Signed)
Encouraged heart healthy diet, increase exercise, avoid trans fats, consider a krill oil cap daily 

## 2016-05-24 DIAGNOSIS — Z01419 Encounter for gynecological examination (general) (routine) without abnormal findings: Secondary | ICD-10-CM | POA: Diagnosis not present

## 2016-05-24 DIAGNOSIS — Z1231 Encounter for screening mammogram for malignant neoplasm of breast: Secondary | ICD-10-CM | POA: Diagnosis not present

## 2016-05-24 DIAGNOSIS — Z6820 Body mass index (BMI) 20.0-20.9, adult: Secondary | ICD-10-CM | POA: Diagnosis not present

## 2016-05-24 LAB — HM PAP SMEAR: HM Pap smear: NEGATIVE

## 2016-05-30 ENCOUNTER — Encounter: Payer: Self-pay | Admitting: Family Medicine

## 2016-05-31 ENCOUNTER — Other Ambulatory Visit: Payer: Self-pay

## 2016-05-31 MED ORDER — RANITIDINE HCL 150 MG PO TABS: 150.0000 mg | ORAL_TABLET | Freq: Every day | ORAL | 1 refills | Status: DC

## 2016-05-31 MED FILL — raNITIdine HCL 150 MG TABS: 150 | 30 days supply | Qty: 30 | Fill #0

## 2016-06-06 ENCOUNTER — Encounter: Payer: Self-pay | Admitting: Family Medicine

## 2016-06-12 MED FILL — GAVILYTE-G SOLUTION: 236 | 84 days supply | Qty: 48000 | Fill #3

## 2016-12-20 MED FILL — POLYETHYLENE GLYCOL 3350 PO: 90 days supply | Qty: 3162 | Fill #0

## 2017-02-22 ENCOUNTER — Telehealth: Payer: Self-pay | Admitting: Family Medicine

## 2017-02-22 NOTE — Telephone Encounter (Signed)
OK with me.

## 2017-02-22 NOTE — Telephone Encounter (Signed)
Patient calling to switch PCP from Dr. Danise EdgeStacey Blyth of [place] to Dr. Helane RimaErica  Wallace [facility].  Please respond at your earliest convenience to acknowledge the patients request.  Thank you,  -LL

## 2017-02-22 NOTE — Telephone Encounter (Signed)
Okay 

## 2017-03-20 MED FILL — POLYETHYLENE GLYCOL 3350 PO: 90 days supply | Qty: 3162 | Fill #1

## 2017-04-20 ENCOUNTER — Ambulatory Visit: Payer: 59 | Admitting: Family Medicine

## 2017-05-24 ENCOUNTER — Encounter: Payer: Self-pay | Admitting: Family Medicine

## 2017-05-24 ENCOUNTER — Encounter: Payer: 59 | Admitting: Family Medicine

## 2017-05-24 ENCOUNTER — Ambulatory Visit (INDEPENDENT_AMBULATORY_CARE_PROVIDER_SITE_OTHER): Payer: 59 | Admitting: Family Medicine

## 2017-05-24 VITALS — BP 116/74 | HR 70 | Temp 98.1°F | Ht 64.0 in | Wt 113.8 lb

## 2017-05-24 DIAGNOSIS — E559 Vitamin D deficiency, unspecified: Secondary | ICD-10-CM | POA: Insufficient documentation

## 2017-05-24 DIAGNOSIS — Z0000 Encounter for general adult medical examination without abnormal findings: Secondary | ICD-10-CM

## 2017-05-24 DIAGNOSIS — E785 Hyperlipidemia, unspecified: Secondary | ICD-10-CM

## 2017-05-24 LAB — VITAMIN D 25 HYDROXY (VIT D DEFICIENCY, FRACTURES): VITD: 36.15 ng/mL (ref 30.00–100.00)

## 2017-05-24 LAB — LIPID PANEL
Cholesterol: 215 mg/dL — ABNORMAL HIGH (ref 0–200)
HDL: 93.1 mg/dL (ref >39.00)
LDL Cholesterol: 109 mg/dL — ABNORMAL HIGH (ref 0–99)
NonHDL: 122.14
Total CHOL/HDL Ratio: 2
Triglycerides: 64 mg/dL (ref 0.0–149.0)
VLDL: 12.8 mg/dL (ref 0.0–40.0)

## 2017-05-24 LAB — COMPREHENSIVE METABOLIC PANEL
ALT: 15 U/L (ref 0–35)
AST: 15 U/L (ref 0–37)
Albumin: 4.4 g/dL (ref 3.5–5.2)
Alkaline Phosphatase: 56 U/L (ref 39–117)
BUN: 20 mg/dL (ref 6–23)
CO2: 30 mEq/L (ref 19–32)
Calcium: 9.8 mg/dL (ref 8.4–10.5)
Chloride: 104 mEq/L (ref 96–112)
Creatinine, Ser: 0.93 mg/dL (ref 0.40–1.20)
GFR: 64.11 mL/min (ref >60.00)
Glucose, Bld: 86 mg/dL (ref 70–99)
Potassium: 4 mEq/L (ref 3.5–5.1)
Sodium: 142 mEq/L (ref 135–145)
Total Bilirubin: 0.6 mg/dL (ref 0.2–1.2)
Total Protein: 7 g/dL (ref 6.0–8.3)

## 2017-05-24 LAB — CBC WITH DIFFERENTIAL/PLATELET
Basophils Absolute: 0 10*3/uL (ref 0.0–0.1)
Basophils Relative: 1 % (ref 0.0–3.0)
Eosinophils Absolute: 0.1 10*3/uL (ref 0.0–0.7)
Eosinophils Relative: 2.1 % (ref 0.0–5.0)
HCT: 43.8 % (ref 36.0–46.0)
Hemoglobin: 14.8 g/dL (ref 12.0–15.0)
Lymphocytes Relative: 46.3 % — ABNORMAL HIGH (ref 12.0–46.0)
Lymphs Abs: 1.8 10*3/uL (ref 0.7–4.0)
MCHC: 33.9 g/dL (ref 30.0–36.0)
MCV: 91.7 fl (ref 78.0–100.0)
Monocytes Absolute: 0.4 10*3/uL (ref 0.1–1.0)
Monocytes Relative: 11 % (ref 3.0–12.0)
Neutro Abs: 1.5 10*3/uL (ref 1.4–7.7)
Neutrophils Relative %: 39.6 % — ABNORMAL LOW (ref 43.0–77.0)
Platelets: 316 10*3/uL (ref 150.0–400.0)
RBC: 4.78 Mil/uL (ref 3.87–5.11)
RDW: 13.2 % (ref 11.5–15.5)
WBC: 3.8 10*3/uL — ABNORMAL LOW (ref 4.0–10.5)

## 2017-05-24 LAB — TSH: TSH: 0.95 u[IU]/mL (ref 0.35–4.50)

## 2017-05-26 ENCOUNTER — Encounter: Payer: Self-pay | Admitting: Family Medicine

## 2017-05-26 NOTE — Progress Notes (Signed)
Subjective:    Norma Greene is a 66 y.o. female and is here for a comprehensive physical exam.  Pertinent Gynecological History: No LMP recorded. Patient is postmenopausal. Last mammogram: normal.  Health Maintenance Due  Topic Date Due  . DEXA SCAN  06/02/2016  . PNA vac Low Risk Adult (1 of 2 - PCV13) 06/02/2016   PMHx, SurgHx, SocialHx, Medications, and Allergies were reviewed in the Visit Navigator and updated as appropriate.   Past Medical History:  Diagnosis Date  . Allergy-induced asthma   . Chicken pox as a child  . Headache 05/17/2014  . History of chicken pox   . Hyperlipidemia, mild 05/23/2015  . IBS (irritable bowel syndrome)    Dr. Kinnie ScalesMedoff  . Insomnia 05/17/2014  . Measles as a child  . Palpitations 05/17/2015  . Seasonal allergies    Past Surgical History:  Procedure Laterality Date  . BREAST SURGERY Bilateral    Lumpectomy  . DILATION AND CURETTAGE OF UTERUS  1983   Missed AB  . MANDIBLE SURGERY  1985 and 1996  . WISDOM TOOTH EXTRACTION  1972   Family History  Problem Relation Age of Onset  . Hypertension Father   . Hyperlipidemia Father   . Heart failure Father   . Atrial fibrillation Father   . Cancer Father        lung, soft tissue in hand, thigh, basal cell in ear- smoker then quit  . Atrial fibrillation Mother   . Dementia Mother   . Hypertension Son   . Cancer Maternal Grandmother        ? breast cancer- masectomy  . Heart disease Paternal Grandmother   . Heart disease Paternal Grandfather   . Depression Daughter   . Heart attack Neg Hx   . Sudden death Neg Hx    Social History  Substance Use Topics  . Smoking status: Never Smoker  . Smokeless tobacco: Never Used  . Alcohol use Yes     Comment: Wine on weekends   Review of Systems:   Pertinent items are noted in the HPI. Otherwise, ROS is negative.  Objective:   BP 116/74   Pulse 70   Temp 98.1 F (36.7 C) (Oral)   Ht 5\' 4"  (1.626 m)   Wt 113 lb 12.8 oz (51.6 kg)    SpO2 95%   BMI 19.53 kg/m    Wt Readings from Last 3 Encounters:  05/24/17 113 lb 12.8 oz (51.6 kg)  05/22/16 120 lb 8 oz (54.7 kg)  05/17/15 116 lb (52.6 kg)     Ht Readings from Last 3 Encounters:  05/24/17 5\' 4"  (1.626 m)  05/22/16 5\' 4"  (1.626 m)  05/17/15 5' 3.5" (1.613 m)   General appearance: alert, cooperative and appears stated age. Head: normocephalic, without obvious abnormality, atraumatic. Neck: no adenopathy, supple, symmetrical, trachea midline; thyroid not enlarged, symmetric, no tenderness/mass/nodules. Lungs: clear to auscultation bilaterally. Heart: regular rate and rhythm Abdomen: soft, non-tender; no masses,  no organomegaly. Extremities: extremities normal, atraumatic, no cyanosis or edema. Skin: skin color, texture, turgor normal, no rashes or lesions. Lymph: cervical, supraclavicular, and axillary nodes normal; no abnormal inguinal nodes palpated. Neurologic: grossly normal.  Assessment/Plan:   Norma Greene was seen today for establish care.  Diagnoses and all orders for this visit:  Routine physical examination -     CBC with Differential/Platelet -     Comprehensive metabolic panel -     Lipid panel -     VITAMIN D 25  Hydroxy (Vit-D Deficiency, Fractures) -     TSH  Vitamin D deficiency -     VITAMIN D 25 Hydroxy (Vit-D Deficiency, Fractures)  Hyperlipidemia, mild -     Lipid panel   Patient Counseling: [x]    Nutrition: Stressed importance of moderation in sodium/caffeine intake, saturated fat and cholesterol, caloric balance, sufficient intake of fresh fruits, vegetables, fiber, calcium, iron, and 1 mg of folate supplement per day (for females capable of pregnancy).  [x]    Stressed the importance of regular exercise.   [x]    Substance Abuse: Discussed cessation/primary prevention of tobacco, alcohol, or other drug use; driving or other dangerous activities under the influence; availability of treatment for abuse.   [x]    Injury prevention:  Discussed safety belts, safety helmets, smoke detector, smoking near bedding or upholstery.   [x]    Sexuality: Discussed sexually transmitted diseases, partner selection, use of condoms, avoidance of unintended pregnancy  and contraceptive alternatives.  [x]    Dental health: Discussed importance of regular tooth brushing, flossing, and dental visits.  [x]    Health maintenance and immunizations reviewed. Please refer to Health maintenance section.   Helane Rima, DO [facility]

## 2017-05-27 NOTE — Progress Notes (Signed)
Need to change PMD in chart. [facility]

## 2017-05-31 DIAGNOSIS — Z6820 Body mass index (BMI) 20.0-20.9, adult: Secondary | ICD-10-CM | POA: Diagnosis not present

## 2017-05-31 DIAGNOSIS — Z01419 Encounter for gynecological examination (general) (routine) without abnormal findings: Secondary | ICD-10-CM | POA: Diagnosis not present

## 2017-05-31 DIAGNOSIS — Z1231 Encounter for screening mammogram for malignant neoplasm of breast: Secondary | ICD-10-CM | POA: Diagnosis not present

## 2017-05-31 DIAGNOSIS — Z13820 Encounter for screening for osteoporosis: Secondary | ICD-10-CM | POA: Diagnosis not present

## 2017-05-31 LAB — HM DEXA SCAN

## 2017-05-31 MED FILL — IBANDRONATE NA 150 MG TAB: 150 | 30 days supply | Qty: 1 | Fill #0

## 2017-06-04 LAB — HM MAMMOGRAPHY

## 2017-06-14 ENCOUNTER — Encounter: Payer: Self-pay | Admitting: Surgical

## 2017-06-18 MED FILL — POLYETHYLENE GLYCOL 3350 PO: 90 days supply | Qty: 3162 | Fill #2

## 2017-06-27 ENCOUNTER — Ambulatory Visit: Payer: 59 | Admitting: Family Medicine

## 2017-06-28 ENCOUNTER — Encounter: Payer: Self-pay | Admitting: Family Medicine

## 2017-06-28 ENCOUNTER — Ambulatory Visit (INDEPENDENT_AMBULATORY_CARE_PROVIDER_SITE_OTHER): Payer: 59 | Admitting: Family Medicine

## 2017-06-28 VITALS — BP 130/72 | HR 74 | Temp 98.0°F | Wt 113.6 lb

## 2017-06-28 DIAGNOSIS — L249 Irritant contact dermatitis, unspecified cause: Secondary | ICD-10-CM | POA: Diagnosis not present

## 2017-06-28 MED ORDER — PREDNISONE 5 MG PO TABS: 5.0000 mg | ORAL_TABLET | Freq: Every day | ORAL | 0 refills | Status: DC

## 2017-06-28 MED ORDER — METHYLPREDNISOLONE ACETATE 80 MG/ML IJ SUSP: 80.0000 mg | Freq: Once | INTRAMUSCULAR | Status: DC

## 2017-06-28 MED ORDER — RANITIDINE HCL 150 MG PO TABS: 150.0000 mg | ORAL_TABLET | Freq: Two times a day (BID) | ORAL | 0 refills | Status: DC

## 2017-06-28 MED FILL — raNITIdine HCL 150 MG TABS: 150 | 15 days supply | Qty: 30 | Fill #0

## 2017-06-28 MED FILL — predniSONE 5 MG TABS: 5 | 6 days supply | Qty: 21 | Fill #0

## 2017-06-28 NOTE — Progress Notes (Signed)
   Neomia DearSharon E Brabender is a 66 y.o. female here for an acute visit.  History of Present Illness:   Rash  This is a new problem. The current episode started in the past 7 days. The problem has been gradually worsening since onset. The affected locations include the face, right shoulder, left shoulder, left axilla and right axilla. The rash is characterized by burning, redness and swelling. She was exposed to nothing. Pertinent negatives include no anorexia, congestion, cough, diarrhea, facial edema, fatigue, fever, joint pain, nail changes, rhinorrhea, shortness of breath, sore throat or vomiting. Past treatments include topical steroids, anti-itch cream and antihistamine. The treatment provided no relief.   PMHx, SurgHx, SocialHx, Medications, and Allergies were reviewed in the Visit Navigator and updated as appropriate.  Current Medications:   .  Calcium Carb-Cholecalciferol (CALCIUM + D3) 600-200 MG-UNIT TABS, Take by mouth., Disp: , Rfl:  .  fexofenadine (ALLEGRA) 180 MG tablet, Take 180 mg by mouth daily as needed. , Disp: , Rfl:  .  ibuprofen (ADVIL,MOTRIN) 200 MG tablet, Take 200 mg by mouth every 6 (six) hours as needed., Disp: , Rfl:  .  Multiple Vitamin (MULTIVITAMIN) tablet, Take 1 tablet by mouth daily., Disp: , Rfl:  .  polyethylene glycol powder (GLYCOLAX/MIRALAX) powder, Take 1 capful twice daily, Disp: , Rfl:    No Known Allergies   Review of Systems:   Pertinent items are noted in the HPI. Otherwise, ROS is negative.  Vitals:   Vitals:   06/28/17 1322  BP: 130/72  Pulse: 74  Temp: 98 F (36.7 C)  TempSrc: Oral  SpO2: 96%  Weight: 113 lb 9.6 oz (51.5 kg)     Body mass index is 19.5 kg/m.   Physical Exam:   Physical Exam  Constitutional: She appears well-nourished.  HENT:  Head: Normocephalic and atraumatic.  Eyes: EOM are normal. Pupils are equal, round, and reactive to light.  Neck: Normal range of motion. Neck supple.  Cardiovascular: Normal rate, regular  rhythm, normal heart sounds and intact distal pulses.  Pulmonary/Chest: Effort normal.  Abdominal: Soft.  Skin: Skin is warm.  Erythematous papular rash bilateral lateral face, upper arms.   Psychiatric: She has a normal mood and affect. Her behavior is normal.  Nursing note and vitals reviewed.   Assessment and Plan:   Jasmine DecemberSharon was seen today for rash.  Diagnoses and all orders for this visit:  Irritant contact dermatitis, unspecified trigger -     predniSONE (DELTASONE) 5 MG tablet; Take 1 tablet (5 mg total) by mouth daily with breakfast. 6-5-4-3-2-1-off -     ranitidine (ZANTAC) 150 MG tablet; Take 1 tablet (150 mg total) by mouth 2 (two) times daily.   . Reviewed expectations re: course of current medical issues. . Discussed self-management of symptoms. . Outlined signs and symptoms indicating need for more acute intervention. . Patient verbalized understanding and all questions were answered. Marland Kitchen. Health Maintenance issues including appropriate healthy diet, exercise, and smoking avoidance were discussed with patient. . See orders for this visit as documented in the electronic medical record. . Patient received an After Visit Summary.   Helane RimaErica Enis Leatherwood, DO [place] 06/28/2017  Future Appointments  Date Time Provider Department Center  05/23/2018  7:40 AM Helane RimaWallace, Francenia Chimenti, DO [facility]

## 2017-06-30 ENCOUNTER — Encounter: Payer: Self-pay | Admitting: Family Medicine

## 2017-09-20 MED FILL — SM CLEARLAX POWDER: 87 days supply | Qty: 3060 | Fill #3

## 2017-11-20 ENCOUNTER — Encounter: Payer: Self-pay | Admitting: Family Medicine

## 2017-11-21 ENCOUNTER — Other Ambulatory Visit: Payer: Self-pay | Admitting: Surgical

## 2017-11-21 DIAGNOSIS — T7840XS Allergy, unspecified, sequela: Secondary | ICD-10-CM

## 2017-11-21 MED ORDER — FLUTICASONE PROPIONATE 50 MCG/ACT NA SUSP: 2.0000 | Freq: Every day | NASAL | 6 refills | Status: AC

## 2017-11-21 MED ORDER — MONTELUKAST SODIUM 10 MG PO TABS: 10.0000 mg | ORAL_TABLET | Freq: Every day | ORAL | 3 refills | Status: AC

## 2017-11-21 MED FILL — FLUTICASONE PROP 50 MCG SPR: 50 | 30 days supply | Qty: 16 | Fill #0

## 2017-11-21 MED FILL — MONTELUKAST SOD 10 MG TAB: 10 | 30 days supply | Qty: 30 | Fill #0

## 2017-12-31 MED FILL — SM CLEARLAX POWDER: 84 days supply | Qty: 3060 | Fill #0

## 2018-01-01 DIAGNOSIS — H01022 Squamous blepharitis right lower eyelid: Secondary | ICD-10-CM | POA: Diagnosis not present

## 2018-01-01 DIAGNOSIS — H01024 Squamous blepharitis left upper eyelid: Secondary | ICD-10-CM | POA: Diagnosis not present

## 2018-01-01 DIAGNOSIS — H01025 Squamous blepharitis left lower eyelid: Secondary | ICD-10-CM | POA: Diagnosis not present

## 2018-01-01 DIAGNOSIS — H40013 Open angle with borderline findings, low risk, bilateral: Secondary | ICD-10-CM | POA: Diagnosis not present

## 2018-01-01 DIAGNOSIS — H10413 Chronic giant papillary conjunctivitis, bilateral: Secondary | ICD-10-CM | POA: Diagnosis not present

## 2018-01-01 DIAGNOSIS — H01021 Squamous blepharitis right upper eyelid: Secondary | ICD-10-CM | POA: Diagnosis not present

## 2018-02-01 DIAGNOSIS — H01021 Squamous blepharitis right upper eyelid: Secondary | ICD-10-CM | POA: Diagnosis not present

## 2018-02-01 DIAGNOSIS — H01025 Squamous blepharitis left lower eyelid: Secondary | ICD-10-CM | POA: Diagnosis not present

## 2018-02-01 DIAGNOSIS — H10413 Chronic giant papillary conjunctivitis, bilateral: Secondary | ICD-10-CM | POA: Diagnosis not present

## 2018-02-01 DIAGNOSIS — H01024 Squamous blepharitis left upper eyelid: Secondary | ICD-10-CM | POA: Diagnosis not present

## 2018-02-01 DIAGNOSIS — H40013 Open angle with borderline findings, low risk, bilateral: Secondary | ICD-10-CM | POA: Diagnosis not present

## 2018-02-01 DIAGNOSIS — H01022 Squamous blepharitis right lower eyelid: Secondary | ICD-10-CM | POA: Diagnosis not present

## 2018-03-04 ENCOUNTER — Encounter: Payer: Self-pay | Admitting: Family Medicine

## 2018-03-05 ENCOUNTER — Encounter: Payer: Self-pay | Admitting: Family Medicine

## 2018-03-05 ENCOUNTER — Ambulatory Visit (INDEPENDENT_AMBULATORY_CARE_PROVIDER_SITE_OTHER): Payer: 59 | Admitting: Family Medicine

## 2018-03-05 VITALS — BP 118/74 | HR 71 | Temp 98.4°F | Ht 64.0 in | Wt 113.0 lb

## 2018-03-05 DIAGNOSIS — R05 Cough: Secondary | ICD-10-CM

## 2018-03-05 DIAGNOSIS — R059 Cough, unspecified: Secondary | ICD-10-CM

## 2018-03-05 MED ORDER — HYDROCOD POLST-CPM POLST ER 10-8 MG/5ML PO SUER: 5.0000 mL | Freq: Every evening | ORAL | 0 refills | Status: DC | PRN

## 2018-03-05 MED ORDER — PREDNISONE 5 MG PO TABS: ORAL_TABLET | ORAL | 0 refills | Status: DC

## 2018-03-05 MED ORDER — AZITHROMYCIN 250 MG PO TABS: ORAL_TABLET | ORAL | 0 refills | Status: DC

## 2018-03-05 MED FILL — HYDROCODONE-CHLORPHEN ER SU: 10-8 | 23 days supply | Qty: 115 | Fill #0

## 2018-03-05 MED FILL — AZITHROMYCIN 250 MG TABLET: 250 | 5 days supply | Qty: 6 | Fill #0

## 2018-03-05 MED FILL — predniSONE 5 MG TABS: 5 | 6 days supply | Qty: 21 | Fill #0

## 2018-03-05 NOTE — Progress Notes (Signed)
   Neomia DearSharon E Kinney is a 67 y.o. female here for an acute visit.  History of Present Illness:   Britt BottomJamie Wheeley CMA acting as scribe for Dr. Earlene PlaterWallace.  HPI: Patient comes in today for an acute issue. She has had upper respiratory symptoms for over a week. Symptoms started with a sore throat and some congestion. Patient started taking Mucinex with no relief. She now feels like it is in her chest. She has taken her albuterol inhaler with not much relief.   PMHx, SurgHx, SocialHx, Medications, and Allergies were reviewed in the Visit Navigator and updated as appropriate.  Current Medications:   .  Calcium Carb-Cholecalciferol (CALCIUM + D3) 600-200 MG-UNIT TABS, Take by mouth., Disp: , Rfl:  .  fexofenadine (ALLEGRA) 180 MG tablet, Take 180 mg by mouth daily as needed. , Disp: , Rfl:  .  fluticasone (FLONASE) 50 MCG/ACT nasal spray, Place 2 sprays into both nostrils daily., Disp: 16 g, Rfl: 6 .  ibuprofen (ADVIL,MOTRIN) 200 MG tablet, Take 200 mg by mouth every 6 (six) hours as needed., Disp: , Rfl:  .  montelukast (SINGULAIR) 10 MG tablet, Take 1 tablet (10 mg total) by mouth at bedtime. .  Multiple Vitamin (MULTIVITAMIN) tablet, Take 1 tablet by mouth daily., Disp: , Rfl:  .  polyethylene glycol powder (GLYCOLAX/MIRALAX) powder, Take 1 capful twice daily, Disp: , Rfl:   No Known Allergies   Review of Systems:   Pertinent items are noted in the HPI. Otherwise, ROS is negative.  Vitals:   Vitals:   03/05/18 1314  BP: 118/74  Pulse: 71  Temp: 98.4 F (36.9 C)  TempSrc: Oral  SpO2: 94%  Weight: 113 lb (51.3 kg)  Height: 5\' 4"  (1.626 m)     Body mass index is 19.4 kg/m.  Physical Exam:   Physical Exam  Constitutional: She appears well-nourished.  HENT:  Head: Normocephalic and atraumatic.  Eyes: Pupils are equal, round, and reactive to light. EOM are normal.  Neck: Normal range of motion. Neck supple.  Cardiovascular: Normal rate, regular rhythm, normal heart sounds and  intact distal pulses.  Pulmonary/Chest: Effort normal.  Abdominal: Soft.  Skin: Skin is warm.  Psychiatric: She has a normal mood and affect. Her behavior is normal.  Nursing note and vitals reviewed.  Assessment and Plan:   Jasmine DecemberSharon was seen today for uri.  Diagnoses and all orders for this visit:  Cough -     predniSONE (DELTASONE) 5 MG tablet; 6,5,4,3,2,1,  done -     azithromycin (ZITHROMAX) 250 MG tablet; Take two tablets first day then one tablet daily til done. -     chlorpheniramine-HYDROcodone (TUSSIONEX PENNKINETIC ER) 10-8 MG/5ML SUER; Take 5 mLs by mouth at bedtime as needed for cough.   . Reviewed expectations re: course of current medical issues. . Discussed self-management of symptoms. . Outlined signs and symptoms indicating need for more acute intervention. . Patient verbalized understanding and all questions were answered. Marland Kitchen. Health Maintenance issues including appropriate healthy diet, exercise, and smoking avoidance were discussed with patient. . See orders for this visit as documented in the electronic medical record. . Patient received an After Visit Summary.  CMA served as Neurosurgeonscribe during this visit. History, Physical, and Plan performed by medical provider. The above documentation has been reviewed and is accurate and complete. Helane RimaErica Barlow Harrison, D.O.  Helane RimaErica Bentli Llorente, DO [place] 03/08/2018

## 2018-03-08 ENCOUNTER — Encounter: Payer: Self-pay | Admitting: Family Medicine

## 2018-03-19 MED FILL — SM CLEARLAX POWDER: 84 days supply | Qty: 3060 | Fill #1

## 2018-05-23 ENCOUNTER — Ambulatory Visit: Payer: 59 | Admitting: Family Medicine

## 2018-05-24 ENCOUNTER — Ambulatory Visit (INDEPENDENT_AMBULATORY_CARE_PROVIDER_SITE_OTHER): Payer: 59 | Admitting: Family Medicine

## 2018-05-24 ENCOUNTER — Encounter: Payer: Self-pay | Admitting: Family Medicine

## 2018-05-24 VITALS — BP 126/72 | HR 63 | Temp 97.9°F | Ht 62.5 in | Wt 113.0 lb

## 2018-05-24 DIAGNOSIS — R7989 Other specified abnormal findings of blood chemistry: Secondary | ICD-10-CM | POA: Diagnosis not present

## 2018-05-24 DIAGNOSIS — K581 Irritable bowel syndrome with constipation: Secondary | ICD-10-CM

## 2018-05-24 DIAGNOSIS — Z0000 Encounter for general adult medical examination without abnormal findings: Secondary | ICD-10-CM | POA: Diagnosis not present

## 2018-05-24 DIAGNOSIS — D72819 Decreased white blood cell count, unspecified: Secondary | ICD-10-CM | POA: Diagnosis not present

## 2018-05-24 DIAGNOSIS — R922 Inconclusive mammogram: Secondary | ICD-10-CM | POA: Diagnosis not present

## 2018-05-24 DIAGNOSIS — J302 Other seasonal allergic rhinitis: Secondary | ICD-10-CM

## 2018-05-24 DIAGNOSIS — M810 Age-related osteoporosis without current pathological fracture: Secondary | ICD-10-CM

## 2018-05-24 DIAGNOSIS — R9230 Dense breasts, unspecified: Secondary | ICD-10-CM | POA: Insufficient documentation

## 2018-05-24 DIAGNOSIS — Z23 Encounter for immunization: Secondary | ICD-10-CM

## 2018-05-24 DIAGNOSIS — E785 Hyperlipidemia, unspecified: Secondary | ICD-10-CM | POA: Insufficient documentation

## 2018-05-24 LAB — COMPREHENSIVE METABOLIC PANEL
ALT: 15 U/L (ref 0–35)
AST: 15 U/L (ref 0–37)
Albumin: 4.6 g/dL (ref 3.5–5.2)
Alkaline Phosphatase: 59 U/L (ref 39–117)
BUN: 22 mg/dL (ref 6–23)
CO2: 30 mEq/L (ref 19–32)
Calcium: 10.1 mg/dL (ref 8.4–10.5)
Chloride: 104 mEq/L (ref 96–112)
Creatinine, Ser: 0.91 mg/dL (ref 0.40–1.20)
GFR: 65.54 mL/min (ref >60.00)
Glucose, Bld: 100 mg/dL — ABNORMAL HIGH (ref 70–99)
Potassium: 4.4 mEq/L (ref 3.5–5.1)
Sodium: 142 mEq/L (ref 135–145)
Total Bilirubin: 0.7 mg/dL (ref 0.2–1.2)
Total Protein: 7 g/dL (ref 6.0–8.3)

## 2018-05-24 LAB — CBC WITH DIFFERENTIAL/PLATELET
Basophils Absolute: 0 10*3/uL (ref 0.0–0.1)
Basophils Relative: 0.9 % (ref 0.0–3.0)
Eosinophils Absolute: 0.1 10*3/uL (ref 0.0–0.7)
Eosinophils Relative: 2.9 % (ref 0.0–5.0)
HCT: 43.1 % (ref 36.0–46.0)
Hemoglobin: 14.7 g/dL (ref 12.0–15.0)
Lymphocytes Relative: 46.9 % — ABNORMAL HIGH (ref 12.0–46.0)
Lymphs Abs: 2.1 10*3/uL (ref 0.7–4.0)
MCHC: 34.1 g/dL (ref 30.0–36.0)
MCV: 90.6 fl (ref 78.0–100.0)
Monocytes Absolute: 0.4 10*3/uL (ref 0.1–1.0)
Monocytes Relative: 9.8 % (ref 3.0–12.0)
Neutro Abs: 1.8 10*3/uL (ref 1.4–7.7)
Neutrophils Relative %: 39.5 % — ABNORMAL LOW (ref 43.0–77.0)
Platelets: 276 10*3/uL (ref 150.0–400.0)
RBC: 4.75 Mil/uL (ref 3.87–5.11)
RDW: 13.7 % (ref 11.5–15.5)
WBC: 4.6 10*3/uL (ref 4.0–10.5)

## 2018-05-24 LAB — LIPID PANEL
Cholesterol: 201 mg/dL — ABNORMAL HIGH (ref 0–200)
HDL: 90.1 mg/dL (ref >39.00)
LDL Cholesterol: 96 mg/dL (ref 0–99)
NonHDL: 110.69
Total CHOL/HDL Ratio: 2
Triglycerides: 73 mg/dL (ref 0.0–149.0)
VLDL: 14.6 mg/dL (ref 0.0–40.0)

## 2018-05-24 NOTE — Progress Notes (Signed)
Subjective:    Norma Greene is a 67 y.o. female and is here for a comprehensive physical exam.  .  Calcium Carb-Cholecalciferol (CALCIUM + D3) 600-200 MG-UNIT TABS, Take by mouth., Disp: , Rfl:  .  fexofenadine (ALLEGRA) 180 MG tablet, Take 180 mg by mouth daily as needed. , Disp: , Rfl:  .  fluticasone (FLONASE) 50 MCG/ACT nasal spray, Place 2 sprays into both nostrils daily., Disp: 16 g, Rfl: 6 .  ibuprofen (ADVIL,MOTRIN) 200 MG tablet, Take 200 mg by mouth every 6 (six) hours as needed., Disp: , Rfl:  .  montelukast (SINGULAIR) 10 MG tablet, Take 1 tablet (10 mg total) by mouth at bedtime., Disp: 30 tablet .  Multiple Vitamin (MULTIVITAMIN) tablet, Take 1 tablet by mouth daily., Disp: , Rfl:  .  polyethylene glycol powder (GLYCOLAX/MIRALAX) powder, Take 1 capful twice daily, Disp: , Rfl:   PMHx, SurgHx, SocialHx, Medications, and Allergies were reviewed in the Visit Navigator and updated as appropriate.   Past Medical History:  Diagnosis Date  . Allergy-induced asthma   . Chicken pox as a child  . Headache 05/17/2014  . History of chicken pox   . Hyperlipidemia, mild 05/23/2015  . IBS (irritable bowel syndrome)    Dr. Kinnie Scales  . Insomnia 05/17/2014  . Measles as a child  . Palpitations 05/17/2015  . Seasonal allergies      Past Surgical History:  Procedure Laterality Date  . BREAST SURGERY Bilateral    Lumpectomy  . DILATION AND CURETTAGE OF UTERUS  1983   Missed AB  . MANDIBLE SURGERY  1985 and 1996  . WISDOM TOOTH EXTRACTION  1972     Family History  Problem Relation Age of Onset  . Hypertension Father   . Hyperlipidemia Father   . Heart failure Father   . Atrial fibrillation Father   . Cancer Father        lung, soft tissue in hand, thigh, basal cell in ear- smoker then quit  . Atrial fibrillation Mother   . Dementia Mother   . Hypertension Son   . Cancer Maternal Grandmother        ? breast cancer- masectomy  . Heart disease Paternal Grandmother   .  Heart disease Paternal Grandfather   . Depression Daughter   . Heart attack Neg Hx   . Sudden death Neg Hx     Social History   Tobacco Use  . Smoking status: Never Smoker  . Smokeless tobacco: Never Used  Substance Use Topics  . Alcohol use: Yes    Comment: Wine on weekends  . Drug use: No    Review of Systems:   Pertinent items are noted in the HPI. Otherwise, ROS is negative.  Objective:   BP 126/72   Pulse 63   Temp 97.9 F (36.6 C) (Oral)   Ht 5' 2.5" (1.588 m)   Wt 113 lb (51.3 kg)   SpO2 98%   BMI 20.34 kg/m   General appearance: alert, cooperative and appears stated age. Head: normocephalic, without obvious abnormality, atraumatic. Neck: no adenopathy, supple, symmetrical, trachea midline; thyroid not enlarged, symmetric, no tenderness/mass/nodules. Lungs: clear to auscultation bilaterally. Heart: regular rate and rhythm Abdomen: soft, non-tender; no masses,  no organomegaly. Extremities: extremities normal, atraumatic, no cyanosis or edema. Skin: skin color, texture, turgor normal, no rashes or lesions. Lymph: cervical, supraclavicular, and axillary nodes normal; no abnormal inguinal nodes palpated. Neurologic: grossly normal.  Assessment/Plan:   Norma Greene was seen today for annual  exam.  Diagnoses and all orders for this visit:  Routine physical examination  Leukopenia, unspecified type -     CBC with Differential/Platelet  High serum high density lipoprotein (HDL) -     Lipid panel  Irritable bowel syndrome with constipation -     Comprehensive metabolic panel  Seasonal allergies, uses Allegra, Flonase, and Singulair  Dense breast tissue on mammogram, scans in November with GYN  Osteoporosis, unspecified osteoporosis type, unspecified pathological fracture presence -     CBC with Differential/Platelet -     Comprehensive metabolic panel -     Lipid panel  Need for vaccination against Streptococcus pneumoniae using pneumococcal conjugate  vaccine 13 -     Pneumococcal conjugate vaccine 13-valent   Patient Counseling: [x]    Nutrition: Stressed importance of moderation in sodium/caffeine intake, saturated fat and cholesterol, caloric balance, sufficient intake of fresh fruits, vegetables, fiber, calcium, iron, and 1 mg of folate supplement per day (for females capable of pregnancy).  [x]    Stressed the importance of regular exercise.   [x]    Substance Abuse: Discussed cessation/primary prevention of tobacco, alcohol, or other drug use; driving or other dangerous activities under the influence; availability of treatment for abuse.   [x]    Injury prevention: Discussed safety belts, safety helmets, smoke detector, smoking near bedding or upholstery.   [x]    Sexuality: Discussed sexually transmitted diseases, partner selection, use of condoms, avoidance of unintended pregnancy  and contraceptive alternatives.  [x]    Dental health: Discussed importance of regular tooth brushing, flossing, and dental visits.  [x]    Health maintenance and immunizations reviewed. Please refer to Health maintenance section.   Helane Rima, DO [facility]

## 2018-06-17 MED FILL — SM CLEARLAX POWDER: 84 days supply | Qty: 3060 | Fill #2

## 2018-07-09 ENCOUNTER — Encounter: Payer: Self-pay | Admitting: Family Medicine

## 2018-07-15 ENCOUNTER — Other Ambulatory Visit: Payer: Self-pay

## 2018-07-15 DIAGNOSIS — Z1231 Encounter for screening mammogram for malignant neoplasm of breast: Secondary | ICD-10-CM

## 2018-07-22 ENCOUNTER — Ambulatory Visit
Admission: RE | Admit: 2018-07-22 | Discharge: 2018-07-22 | Disposition: A | Discharge status: Home / Self Care | Payer: 59 | Source: Ambulatory Visit | Attending: Family Medicine | Admitting: Family Medicine

## 2018-07-22 DIAGNOSIS — Z1231 Encounter for screening mammogram for malignant neoplasm of breast: Secondary | ICD-10-CM

## 2018-08-09 DIAGNOSIS — H40013 Open angle with borderline findings, low risk, bilateral: Secondary | ICD-10-CM | POA: Diagnosis not present

## 2018-08-09 DIAGNOSIS — H10413 Chronic giant papillary conjunctivitis, bilateral: Secondary | ICD-10-CM | POA: Diagnosis not present

## 2018-08-09 DIAGNOSIS — H01022 Squamous blepharitis right lower eyelid: Secondary | ICD-10-CM | POA: Diagnosis not present

## 2018-08-09 DIAGNOSIS — H01021 Squamous blepharitis right upper eyelid: Secondary | ICD-10-CM | POA: Diagnosis not present

## 2018-08-09 DIAGNOSIS — H01024 Squamous blepharitis left upper eyelid: Secondary | ICD-10-CM | POA: Diagnosis not present

## 2018-08-09 DIAGNOSIS — H01025 Squamous blepharitis left lower eyelid: Secondary | ICD-10-CM | POA: Diagnosis not present

## 2018-08-21 ENCOUNTER — Ambulatory Visit: Payer: 59

## 2018-08-26 ENCOUNTER — Ambulatory Visit: Payer: Self-pay

## 2018-08-26 NOTE — Telephone Encounter (Signed)
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  Reports that she has a rash all over face eyes around her hair hairline.   Behind ears.    Patient state that she itches  Very much.  Scheduled an appoint for 1/ 28/20 @ 8:00 am.  Patient voices   Understanding.        Reason for Disposition . [1] Applying cream or ointment AND [2] causes severe itch, burning or pain  Answer Assessment - Initial Assessment Questions 1.) CALLER DIAGNOSIS: "What do you think is causing the rash?" (e.g., Chickenpox, Hives, Impetigo, Athlete's Foot, etc.)  I have no idea` 2.) LOCATION:  "Is it widespread or localized?"  3.) NEW MEDICATIONS: "Are you taking any new medicine?" no  Protocols used: RASH OR REDNESS - LOCALIZED-A-AH, RASH - GUIDELINE SELECTION-A-AH

## 2018-08-27 ENCOUNTER — Ambulatory Visit (INDEPENDENT_AMBULATORY_CARE_PROVIDER_SITE_OTHER): Payer: 59 | Admitting: Family Medicine

## 2018-08-27 ENCOUNTER — Encounter: Payer: Self-pay | Admitting: Family Medicine

## 2018-08-27 VITALS — BP 116/78 | HR 50 | Temp 97.7°F | Ht 62.5 in | Wt 117.4 lb

## 2018-08-27 DIAGNOSIS — R21 Rash and other nonspecific skin eruption: Secondary | ICD-10-CM

## 2018-08-27 MED ORDER — METHYLPREDNISOLONE ACETATE 80 MG/ML IJ SUSP
80.0000 mg | Freq: Once | INTRAMUSCULAR | Status: AC
  Administered 2018-08-27: 80 mg via INTRAMUSCULAR

## 2018-08-27 MED ORDER — PREDNISONE 5 MG (21) PO TBPK: ORAL_TABLET | ORAL | 0 refills | Status: DC

## 2018-08-27 MED FILL — predniSONE 5 MG TABS: 5 | 6 days supply | Qty: 21 | Fill #0

## 2018-08-27 NOTE — Progress Notes (Signed)
   Chief Complaint:  Norma Greene is a 68 y.o. female who presents for same day appointment with a chief complaint of rash.   Assessment/Plan:  Rash Consistent with allergic dermatitis.  No red flags or signs of underlying infection/illness.  Given her widespread distribution, will start systemic steroids today.  Will give 80 mg IM Depo-Medrol today.  Start prednisone taper tomorrow-see AVS for instructions.  Discussed reasons return to care and seek emergent care.  Consider allergy testing if continues to have recurrent rashes without triggering event.  Follow-up as needed.     Subjective:  HPI:  Rash, acute problem Started about a week ago. Located on face, knees, and axilla bilaterally.  Are worsening.  No obvious precipitating events.  No new soaps, detergents, lotions, or fragrances.  Rash is very itchy and occasionally burns.  She has tried topical lotion, cortisone cream, allegra, and benadryl which have not helped.  She has a history of severe seasonal allergies.  She had allergy testing done as a kid.  No cough, wheeze, shortness of breath, fevers, chills, or facial swelling. No other obvious alleviating or aggravating factors.    ROS: Per HPI  PMH: She reports that she has never smoked. She has never used smokeless tobacco. She reports current alcohol use. She reports that she does not use drugs.      Objective:  Physical Exam: BP 116/78 (BP Location: Left Arm, Patient Position: Sitting, Cuff Size: Normal)   Pulse (!) 50   Temp 97.7 F (36.5 C) (Oral)   Ht 5' 2.5" (1.588 m)   Wt 117 lb 6.1 oz (53.2 kg)   SpO2 97%   BMI 21.13 kg/m   Gen: NAD, resting comfortably Skin: Maculopapular erythematous rash involving bilateral cheeks and popliteal fossa.  No surrounding erythema.  No bleeding or drainage.     Katina Degree. [name], MD 08/27/2018 8:12 AM

## 2018-08-27 NOTE — Patient Instructions (Signed)
It was very nice to see you today!  You are having a mild allergic reaction.  We will give you a shot of a steroid today because Depo-Medrol.  Start the prednisone tomorrow.  Please take 6 pills the first day, then decrease by 1 pill daily until you run out.  Please let us know if your symptoms worsen or do not improve in the next few days.   Take care, Dr [name]

## 2018-09-13 ENCOUNTER — Ambulatory Visit (INDEPENDENT_AMBULATORY_CARE_PROVIDER_SITE_OTHER): Payer: 59 | Admitting: Internal Medicine

## 2018-09-13 ENCOUNTER — Other Ambulatory Visit: Payer: Self-pay

## 2018-09-13 DIAGNOSIS — R300 Dysuria: Secondary | ICD-10-CM | POA: Insufficient documentation

## 2018-09-13 LAB — POCT URINALYSIS DIPSTICK
Bilirubin, UA: NEGATIVE
Glucose, UA: NEGATIVE
KETONES UA: NEGATIVE
Nitrite, UA: NEGATIVE
PROTEIN UA: NEGATIVE
Spec Grav, UA: 1.02 (ref 1.010–1.025)
Urobilinogen, UA: 0.2 E.U./dL
pH, UA: 6.5 (ref 5.0–8.0)

## 2018-09-13 MED ORDER — NITROFURANTOIN MONOHYD MACRO 100 MG PO CAPS: 100.0000 mg | ORAL_CAPSULE | Freq: Two times a day (BID) | ORAL | 0 refills | Status: DC

## 2018-09-13 MED FILL — NITROFURANTOIN MONO-MCR 100: 100 | 5 days supply | Qty: 10 | Fill #0

## 2018-09-13 MED FILL — SM CLEARLAX POWDER: 84 days supply | Qty: 3060 | Fill #3

## 2018-09-13 NOTE — Progress Notes (Signed)
   CC: Dysuria, frequency   HPI:  Ms.Norma Greene is a 68 y.o. female with past medical history outlined below here for dysuria and frequency. For the details of today's visit, please refer to the assessment and plan.  Past Medical History:  Diagnosis Date  . Allergy-induced asthma   . Chicken pox as a child  . Headache 05/17/2014  . History of chicken pox   . Hyperlipidemia, mild 05/23/2015  . IBS (irritable bowel syndrome)    Dr. Kinnie Scales  . Insomnia 05/17/2014  . Measles as a child  . Palpitations 05/17/2015  . Seasonal allergies     Review of Systems  Constitutional: Negative for chills and fever.  Genitourinary: Positive for dysuria and frequency.    Physical Exam:  Vitals:   09/13/18 0925  BP: 130/74  Pulse: (!) 59  Temp: 98.2 F (36.8 C)  TempSrc: Oral  SpO2: 100%  Weight: 117 lb 3.2 oz (53.2 kg)  Height: 5' 3.5" (1.613 m)    Constitutional: NAD, appears comfortable Cardiovascular: RRR, no m/r/g Pulmonary/Chest: CTAB, no wheezes, rales, or rhonchi.  Abdominal: Soft, non tender, non distended. +BS.  Psychiatric: Normal mood and affect  Assessment & Plan:   See Encounters Tab for problem based charting.  Patient discussed with Dr. Cleda Daub

## 2018-09-13 NOTE — Assessment & Plan Note (Signed)
Patient is here with complaint of dysuria and frequency with associated suprapubic pain x2 days.  Symptoms have been disruptive to her sleep.  She has a history of prior UTIs, she reports 3 episodes in the past 5 years.  She is afebrile, no flank pain.  Urine dipstick analysis showed trace leukocytes and microscopic hematuria.  We will treat empirically with Macrobid and follow-up urine cultures. --Please urinalysis, urine culture -Macrobid 100 mg twice daily x5 days

## 2018-09-13 NOTE — Patient Instructions (Signed)
Ms. Delles,  So sorry you aren't feeling well! Please take macrobid twice daily for 5 days. I will call you when your culture results come in.   If you have any questions or concerns, call our clinic at [phone] or after hours call [phone] and ask for the internal medicine resident on call. Thank you!  Dr. Antony Contras

## 2018-09-13 NOTE — Progress Notes (Signed)
Internal Medicine Clinic Attending  Case discussed with Dr. Guilloud at the time of the visit.  We reviewed the resident's history and exam and pertinent patient test results.  I agree with the assessment, diagnosis, and plan of care documented in the resident's note.  

## 2018-09-14 LAB — URINALYSIS, COMPLETE
Bilirubin, UA: NEGATIVE
Glucose, UA: NEGATIVE
Ketones, UA: NEGATIVE
Nitrite, UA: POSITIVE — AB
Protein, UA: NEGATIVE
RBC, UA: NEGATIVE
Specific Gravity, UA: 1.021 (ref 1.005–1.030)
Urobilinogen, Ur: 0.2 mg/dL (ref 0.2–1.0)
pH, UA: 6.5 (ref 5.0–7.5)

## 2018-09-14 LAB — MICROSCOPIC EXAMINATION
CASTS: NONE SEEN /LPF
WBC, UA: >30 /hpf — AB (ref 0–5)

## 2018-09-15 LAB — URINE CULTURE

## 2018-10-23 ENCOUNTER — Other Ambulatory Visit: Payer: Self-pay | Admitting: Internal Medicine

## 2018-10-23 ENCOUNTER — Other Ambulatory Visit: Payer: Self-pay

## 2018-10-23 ENCOUNTER — Other Ambulatory Visit (INDEPENDENT_AMBULATORY_CARE_PROVIDER_SITE_OTHER): Payer: 59 | Admitting: Internal Medicine

## 2018-10-23 DIAGNOSIS — Z87440 Personal history of urinary (tract) infections: Secondary | ICD-10-CM | POA: Diagnosis not present

## 2018-10-23 DIAGNOSIS — R300 Dysuria: Secondary | ICD-10-CM

## 2018-10-23 DIAGNOSIS — R319 Hematuria, unspecified: Secondary | ICD-10-CM

## 2018-10-23 DIAGNOSIS — R350 Frequency of micturition: Secondary | ICD-10-CM

## 2018-10-23 LAB — POCT URINALYSIS DIPSTICK
Bilirubin, UA: NEGATIVE
Glucose, UA: NEGATIVE
Ketones, UA: NEGATIVE
Nitrite, UA: NEGATIVE
Protein, UA: NEGATIVE
Spec Grav, UA: 1.015 (ref 1.010–1.025)
Urobilinogen, UA: 0.2 E.U./dL
pH, UA: 7 (ref 5.0–8.0)

## 2018-10-23 MED ORDER — SULFAMETHOXAZOLE-TRIMETHOPRIM 800-160 MG PO TABS: 1.0000 | ORAL_TABLET | Freq: Two times a day (BID) | ORAL | 0 refills | Status: DC

## 2018-10-23 MED FILL — SULFAMETHOXAZOLE-TMP DS TAB: 800-160 | 3 days supply | Qty: 6 | Fill #0

## 2018-10-23 NOTE — Addendum Note (Signed)
Addended by: Geralyn Corwin R on: 10/23/2018 01:50 PM   Modules accepted: Orders

## 2018-10-23 NOTE — Assessment & Plan Note (Signed)
History of present illness: Patient states that she has had symptoms of dysuria, urinary frequency and hematuria for the past week.  She states that last month she was treated for urinary tract infection for which she completed a 7 day course of Macrobid. She currently denies any fever/chills, vaginal discharge or flank pain.  Assessment: Dysuria with hematuria Patient presents with recurrent symptoms of her previous urinary tract infection that was treated on 09/13/2018 with a 7 day course of Macrobid. Today her urine dipstick showed moderate leukocytes with large blood.  At this time will treat with a different antibiotic.  Will send for UA and urine culture. If UA with hematuria will have patient repeat UA to assure resolution of hematuria.  If urine culture with no growth would refer to urology.    Plan -bactrim for 3 days -UA and urine culture

## 2018-10-23 NOTE — Addendum Note (Signed)
Addended by: Geralyn Corwin R on: 10/23/2018 03:09 PM   Modules accepted: Orders, Level of Service

## 2018-10-23 NOTE — Progress Notes (Signed)
Internal Medicine Clinic Attending  Case discussed with Dr. Hoffman at the time of the visit.  We reviewed the resident's history and exam and pertinent patient test results.  I agree with the assessment, diagnosis, and plan of care documented in the resident's note.  

## 2018-10-23 NOTE — Progress Notes (Addendum)
   CC: dysuria  HPI:  Norma Greene is a 68 y.o. female with history noted below that presents to the acute care clinic for dysuria. Please see problem based charting for the status of patient's chronic and acute medical conditions.  Past Medical History:  Diagnosis Date  . Allergy-induced asthma   . Chicken pox as a child  . Headache 05/17/2014  . History of chicken pox   . Hyperlipidemia, mild 05/23/2015  . IBS (irritable bowel syndrome)    Dr. Kinnie Scales  . Insomnia 05/17/2014  . Measles as a child  . Palpitations 05/17/2015  . Seasonal allergies     Review of Systems:  Review of Systems  Constitutional: Negative for chills and fever.  Gastrointestinal: Negative for nausea and vomiting.  Genitourinary: Positive for dysuria, frequency and hematuria. Negative for flank pain.  Musculoskeletal: Negative for myalgias.    Physical Exam:  Vitals:   10/23/18 1315  BP: 135/71  Pulse: 60  Temp: 98 F (36.7 C)  TempSrc: Oral  SpO2: 100%  Weight: 115 lb (52.2 kg)   Physical Exam  Constitutional: She is well-developed, well-nourished, and in no distress.  Pulmonary/Chest: Effort normal. No respiratory distress.  Musculoskeletal:        General: No edema.  Skin: Skin is warm and dry.     Assessment & Plan:   See encounters tab for problem based medical decision making.   Patient discussed with Dr. Oswaldo Done

## 2018-10-23 NOTE — Patient Instructions (Signed)
Jasmine December,  For your symptoms you can try AZO (over the counter) to help with urinary symptoms.  Please start taking bactrim 1 pill two times a day for 3 days  I will discuss the results of the UA and urine culture with you when they return in the next day or 2.

## 2018-10-24 LAB — URINALYSIS, ROUTINE W REFLEX MICROSCOPIC
Bilirubin, UA: NEGATIVE
Glucose, UA: NEGATIVE
Ketones, UA: NEGATIVE
Nitrite, UA: POSITIVE — AB
Protein, UA: NEGATIVE
Specific Gravity, UA: 1.012 (ref 1.005–1.030)
Urobilinogen, Ur: 0.2 mg/dL (ref 0.2–1.0)
pH, UA: 6.5 (ref 5.0–7.5)

## 2018-10-24 LAB — MICROSCOPIC EXAMINATION
Casts: NONE SEEN /lpf
Epithelial Cells (non renal): NONE SEEN /hpf (ref 0–10)

## 2018-10-25 LAB — URINE CULTURE

## 2018-12-09 MED FILL — SM CLEARLAX POWDER: 17 | 90 days supply | Qty: 3060 | Fill #0

## 2019-03-26 ENCOUNTER — Other Ambulatory Visit: Payer: Self-pay | Admitting: Internal Medicine

## 2019-03-26 ENCOUNTER — Other Ambulatory Visit (INDEPENDENT_AMBULATORY_CARE_PROVIDER_SITE_OTHER): Payer: 59

## 2019-03-26 ENCOUNTER — Other Ambulatory Visit: Payer: Self-pay

## 2019-03-26 DIAGNOSIS — R8281 Pyuria: Secondary | ICD-10-CM | POA: Diagnosis not present

## 2019-03-26 LAB — POCT URINALYSIS DIPSTICK
Bilirubin, UA: NEGATIVE
Glucose, UA: NEGATIVE
Ketones, UA: NEGATIVE
Nitrite, UA: NEGATIVE
Protein, UA: POSITIVE — AB
Spec Grav, UA: 1.02 (ref 1.010–1.025)
Urobilinogen, UA: 0.2 E.U./dL
pH, UA: 7 (ref 5.0–8.0)

## 2019-03-26 MED ORDER — SULFAMETHOXAZOLE-TRIMETHOPRIM 800-160 MG PO TABS: 1.0000 | ORAL_TABLET | Freq: Two times a day (BID) | ORAL | 0 refills | Status: DC

## 2019-03-26 MED FILL — SULFAMETHOXAZOLE-TMP DS TAB: 800-160 | 3 days supply | Qty: 6 | Fill #0

## 2019-03-26 NOTE — Progress Notes (Signed)
3 days of freq, pain, and hematuria.  Never had any UTI issues until February 2020.  She was prescribed Macrobid for 5 days.  Urine culture was no growth to date.  Symptoms improved but then returned in March 2020.  She was prescribed Bactrim for 3 days and urine culture showed E. coli with pan sensitivity.  Symptoms resolved until 3 days ago.  No instrumentation of bladder, no antibiotics other than what I have listed, no travel, no flank pain.  UA shows large hemoglobin and leukocyte Estrace.  Nitrites are negative.  She feels the Bactrim did a bit better than the nitrofurantoin and so I will re-dose Bactrim for 3 days and check a urine culture.  She will follow-up with her PCP regarding frequent UTIs.

## 2019-03-29 LAB — URINE CULTURE

## 2019-04-01 DIAGNOSIS — H40013 Open angle with borderline findings, low risk, bilateral: Secondary | ICD-10-CM | POA: Diagnosis not present

## 2019-04-01 DIAGNOSIS — H1045 Other chronic allergic conjunctivitis: Secondary | ICD-10-CM | POA: Diagnosis not present

## 2019-04-01 DIAGNOSIS — H0102B Squamous blepharitis left eye, upper and lower eyelids: Secondary | ICD-10-CM | POA: Diagnosis not present

## 2019-04-01 DIAGNOSIS — H0102A Squamous blepharitis right eye, upper and lower eyelids: Secondary | ICD-10-CM | POA: Diagnosis not present

## 2019-04-01 MED FILL — LATANOPROST 0.005% EYE DRP: 0.005 | 25 days supply | Qty: 3 | Fill #0

## 2019-04-09 MED FILL — SM CLEARLAX POWDER: 17 | 90 days supply | Qty: 3060 | Fill #0

## 2019-04-25 ENCOUNTER — Encounter: Payer: Self-pay | Admitting: Family Medicine

## 2019-05-04 NOTE — Progress Notes (Deleted)
Virtual Visit via Video   Due to the COVID-19 pandemic, this visit was completed with telemedicine (audio/video) technology to reduce patient and provider exposure as well as to preserve personal protective equipment.   I connected with Norma Greene by a video enabled telemedicine application and verified that I am speaking with the correct person using two identifiers. Location patient: Home Location provider: [facility], Office Persons participating in the virtual visit: Sehar, [place], DO   I discussed the limitations of evaluation and management by telemedicine and the availability of in person appointments. The patient expressed understanding and agreed to proceed.  Care Team   Patient Care Team: Briscoe Deutscher, DO as PCP - General (Family Medicine) Molli Posey, MD as Consulting Physician (Obstetrics and Gynecology)  Subjective:   HPI:   ROS   Patient Active Problem List   Diagnosis Date Noted  . Dysuria 09/13/2018  . WBC decreased 05/24/2018  . High serum high density lipoprotein (HDL) 05/24/2018  . Seasonal allergies, uses Allegra, Flonase, and Singulair 05/24/2018  . Dense breast tissue on mammogram, scans in November 05/24/2018  . Osteoporosis, Dx 07/31/16, DEXA, prefers no medications other than calcium + D 05/24/2018  . Vitamin D deficiency 05/24/2017  . IBS (irritable bowel syndrome), constipation predominant 05/17/2014  . Insomnia 05/17/2014    Social History   Tobacco Use  . Smoking status: Never Smoker  . Smokeless tobacco: Never Used  Substance Use Topics  . Alcohol use: Yes    Comment: Wine on weekends    Current Outpatient Medications:  .  Calcium Carb-Cholecalciferol (CALCIUM + D3) 600-200 MG-UNIT TABS, Take by mouth., Disp: , Rfl:  .  fexofenadine (ALLEGRA) 180 MG tablet, Take 180 mg by mouth daily as needed. , Disp: , Rfl:  .  fluticasone (FLONASE) 50 MCG/ACT nasal spray, Place 2 sprays into both nostrils daily., Disp:  16 g, Rfl: 6 .  ibuprofen (ADVIL,MOTRIN) 200 MG tablet, Take 200 mg by mouth every 6 (six) hours as needed., Disp: , Rfl:  .  montelukast (SINGULAIR) 10 MG tablet, Take 1 tablet (10 mg total) by mouth at bedtime., Disp: 30 tablet, Rfl: 3 .  Multiple Vitamin (MULTIVITAMIN) tablet, Take 1 tablet by mouth daily., Disp: , Rfl:  .  nitrofurantoin, macrocrystal-monohydrate, (MACROBID) 100 MG capsule, Take 1 capsule (100 mg total) by mouth 2 (two) times daily., Disp: 10 capsule, Rfl: 0 .  polyethylene glycol powder (GLYCOLAX/MIRALAX) powder, Take 1 capful twice daily, Disp: , Rfl:  .  predniSONE (STERAPRED UNI-PAK 21 TAB) 5 MG (21) TBPK tablet, 6-5-4-3-2-1-OFF, Disp: 21 tablet, Rfl: 0 .  ranitidine (ZANTAC) 150 MG tablet, Take 1 tablet (150 mg total) by mouth 2 (two) times daily., Disp: 30 tablet, Rfl: 0 .  sulfamethoxazole-trimethoprim (BACTRIM DS) 800-160 MG tablet, Take 1 tablet by mouth 2 (two) times daily., Disp: 6 tablet, Rfl: 0  Allergies  Allergen Reactions  . Pollen Extract Other (See Comments)    Objective:   VITALS: Per patient if applicable, see vitals. GENERAL: Alert, appears well and in no acute distress. HEENT: Atraumatic, conjunctiva clear, no obvious abnormalities on inspection of external nose and ears. NECK: Normal movements of the head and neck. CARDIOPULMONARY: No increased WOB. Speaking in clear sentences. I:E ratio WNL.  MS: Moves all visible extremities without noticeable abnormality. PSYCH: Pleasant and cooperative, well-groomed. Speech normal rate and rhythm. Affect is appropriate. Insight and judgement are appropriate. Attention is focused, linear, and appropriate.  NEURO: CN grossly intact. Oriented as  arrived to appointment on time with no prompting. Moves both UE equally.  SKIN: No obvious lesions, wounds, erythema, or cyanosis noted on face or hands.  Depression screen [facility] 2/9 09/13/2018 05/24/2017 12/10/2013  Decreased Interest 0 0 0  Down, Depressed, Hopeless 0 0  0  PHQ - 2 Score 0 0 0  Difficult doing work/chores Not difficult at all - -    Assessment and Plan:   There are no diagnoses linked to this encounter.  Marland Kitchen COVID-19 Education: The signs and symptoms of COVID-19 were discussed with the patient and how to seek care for testing if needed. The importance of social distancing was discussed today. . Reviewed expectations re: course of current medical issues. . Discussed self-management of symptoms. . Outlined signs and symptoms indicating need for more acute intervention. . Patient verbalized understanding and all questions were answered. Marland Kitchen Health Maintenance issues including appropriate healthy diet, exercise, and smoking avoidance were discussed with patient. . See orders for this visit as documented in the electronic medical record.  Helane Rima, DO  Records requested if needed. Time spent: *** minutes, of which >50% was spent in obtaining information about her symptoms, reviewing her previous labs, evaluations, and treatments, counseling her about her condition (please see the discussed topics above), and developing a plan to further investigate it; she had a number of questions which I addressed.

## 2019-05-05 ENCOUNTER — Telehealth: Payer: Self-pay | Admitting: Family Medicine

## 2019-05-05 ENCOUNTER — Telehealth: Payer: 59 | Admitting: Family Medicine

## 2019-05-05 NOTE — Telephone Encounter (Signed)
Norma Greene, I sent this to admin staff. She needs to reschedule the appointment if she did not speak with the provider. If she does not want to reschedule, follow the no show/late policy. If there was a phone issue put cancellation reason patient and phone issue as reason and there is not a fee. Thank you

## 2019-05-05 NOTE — Telephone Encounter (Signed)
Copied from [place] [id number]. Topic: General - Other >> May 05, 2019 11:45 AM Leward Quan A wrote: Reason for CRM: Patient called because of a missed Doxy visit she stated that she answered all questions on My Chart and that she said she would be available for this visit. Per patient she was waiting for someone so asking that she is not charged the $50 fee for missed visit since all her numbers work very well. Please advise.Marland Kitchen

## 2019-05-05 NOTE — Telephone Encounter (Signed)
Called both numbers listed for pt to reschedule. No answer, LVM.

## 2019-05-09 ENCOUNTER — Encounter: Payer: Self-pay | Admitting: Family Medicine

## 2019-05-27 ENCOUNTER — Encounter: Payer: 59 | Admitting: Family Medicine

## 2019-05-27 ENCOUNTER — Ambulatory Visit (INDEPENDENT_AMBULATORY_CARE_PROVIDER_SITE_OTHER): Payer: 59 | Admitting: Family Medicine

## 2019-05-27 ENCOUNTER — Other Ambulatory Visit: Payer: Self-pay

## 2019-05-27 ENCOUNTER — Encounter: Payer: Self-pay | Admitting: Family Medicine

## 2019-05-27 VITALS — BP 118/68 | HR 56 | Temp 98.2°F | Ht 63.5 in | Wt 111.5 lb

## 2019-05-27 DIAGNOSIS — Z0001 Encounter for general adult medical examination with abnormal findings: Secondary | ICD-10-CM | POA: Diagnosis not present

## 2019-05-27 DIAGNOSIS — E559 Vitamin D deficiency, unspecified: Secondary | ICD-10-CM | POA: Diagnosis not present

## 2019-05-27 DIAGNOSIS — J302 Other seasonal allergic rhinitis: Secondary | ICD-10-CM | POA: Diagnosis not present

## 2019-05-27 DIAGNOSIS — M810 Age-related osteoporosis without current pathological fracture: Secondary | ICD-10-CM

## 2019-05-27 DIAGNOSIS — K581 Irritable bowel syndrome with constipation: Secondary | ICD-10-CM

## 2019-05-27 DIAGNOSIS — R7989 Other specified abnormal findings of blood chemistry: Secondary | ICD-10-CM

## 2019-05-27 DIAGNOSIS — Z23 Encounter for immunization: Secondary | ICD-10-CM

## 2019-05-27 LAB — LIPID PANEL
Cholesterol: 212 mg/dL — ABNORMAL HIGH (ref 0–200)
HDL: 86 mg/dL (ref >39.00)
LDL Cholesterol: 113 mg/dL — ABNORMAL HIGH (ref 0–99)
NonHDL: 125.9
Total CHOL/HDL Ratio: 2
Triglycerides: 65 mg/dL (ref 0.0–149.0)
VLDL: 13 mg/dL (ref 0.0–40.0)

## 2019-05-27 LAB — COMPREHENSIVE METABOLIC PANEL
ALT: 14 U/L (ref 0–35)
AST: 16 U/L (ref 0–37)
Albumin: 4.5 g/dL (ref 3.5–5.2)
Alkaline Phosphatase: 62 U/L (ref 39–117)
BUN: 16 mg/dL (ref 6–23)
CO2: 30 mEq/L (ref 19–32)
Calcium: 9.7 mg/dL (ref 8.4–10.5)
Chloride: 105 mEq/L (ref 96–112)
Creatinine, Ser: 0.81 mg/dL (ref 0.40–1.20)
GFR: 70.32 mL/min (ref >60.00)
Glucose, Bld: 93 mg/dL (ref 70–99)
Potassium: 4.3 mEq/L (ref 3.5–5.1)
Sodium: 141 mEq/L (ref 135–145)
Total Bilirubin: 0.8 mg/dL (ref 0.2–1.2)
Total Protein: 6.6 g/dL (ref 6.0–8.3)

## 2019-05-27 LAB — CBC
HCT: 42.7 % (ref 36.0–46.0)
Hemoglobin: 14.4 g/dL (ref 12.0–15.0)
MCHC: 33.8 g/dL (ref 30.0–36.0)
MCV: 89.5 fl (ref 78.0–100.0)
Platelets: 258 10*3/uL (ref 150.0–400.0)
RBC: 4.77 Mil/uL (ref 3.87–5.11)
RDW: 13.6 % (ref 11.5–15.5)
WBC: 5.3 10*3/uL (ref 4.0–10.5)

## 2019-05-27 NOTE — Assessment & Plan Note (Signed)
-  vitamin D level 26.17 -Continue weekly oral supplementation. 

## 2019-05-27 NOTE — Assessment & Plan Note (Addendum)
Continue calcium and vitamin D.  Will be getting bone density scan with OB/GYN.  She is hesitant to try Fosamax-would consider Prolia or other bisphosphonate if bone density scan shows worsening osteoporosis.

## 2019-05-27 NOTE — Assessment & Plan Note (Signed)
Stable.  Continue MiraLAX daily.

## 2019-05-27 NOTE — Progress Notes (Signed)
Chief Complaint:  Norma Greene is a 68 y.o. female who presents today for her annual comprehensive physical exam and to transfer care  Assessment/Plan:  IBS (irritable bowel syndrome), constipation predominant Stable.  Continue MiraLAX daily.  Osteoporosis, Dx 07/31/16, DEXA, prefers no medications other than calcium + D Continue calcium and vitamin D.  Will be getting bone density scan with OB/GYN.  She is hesitant to try Fosamax-would consider Prolia or other bisphosphonate if bone density scan shows worsening osteoporosis.  Seasonal allergies, uses Allegra, Flonase, and Singulair Continue Allegra, Flonase, and Singulair as needed.  High serum high density lipoprotein (HDL) Check lipid panel, CBC, C met, TSH.  Vitamin D deficiency Check vitamin D level.  Continue oral supplementation.  Preventative Healthcare: Pneumovax given today.  Check CBC, C met, TSH, and lipid panel.  Will be following up with OB/GYN for pelvic exam and mammogram.  Patient Counseling(The following topics were reviewed and/or handout was given):  -Nutrition: Stressed importance of moderation in sodium/caffeine intake, saturated fat and cholesterol, caloric balance, sufficient intake of fresh fruits, vegetables, and fiber.  -Stressed the importance of regular exercise.   -Substance Abuse: Discussed cessation/primary prevention of tobacco, alcohol, or other drug use; driving or other dangerous activities under the influence; availability of treatment for abuse.   -Injury prevention: Discussed safety belts, safety helmets, smoke detector, smoking near bedding or upholstery.   -Sexuality: Discussed sexually transmitted diseases, partner selection, use of condoms, avoidance of unintended pregnancy and contraceptive alternatives.   -Dental health: Discussed importance of regular tooth brushing, flossing, and dental visits.  -Health maintenance and immunizations reviewed. Please refer to Health maintenance section.   Return to care in 1 year for next preventative visit.     Subjective:  HPI:  She has no acute complaints today.   Her stable, chronic medical conditions are outlined below:   # Seasonal Allergies - On allegra '180mg'$  daily, flonase, and singular '10mg'$  daily - Uses medications as needed seasonally - No recent flares  # IBS-C - Follows with GI - Uses miralax daily   # Osteoporosis - On calcium and vitamin D - Does not want bisphosphonates  Lifestyle Diet: No specific diets or eating plans.  Exercise: Busy with work. Likes to walk.   Depression screen PHQ 2/9 05/27/2019  Decreased Interest 0  Down, Depressed, Hopeless 0  PHQ - 2 Score 0  Difficult doing work/chores -   Health Maintenance Due  Topic Date Due  . PNA vac Low Risk Adult (2 of 2 - PPSV23) 05/25/2019    ROS: Per HPI, otherwise a complete review of systems was negative.   PMH:  The following were reviewed and entered/updated in epic: Past Medical History:  Diagnosis Date  . Allergy-induced asthma   . Chicken pox as a child  . Headache 05/17/2014  . History of chicken pox   . Hyperlipidemia, mild 05/23/2015  . IBS (irritable bowel syndrome)    Dr. Earlean Shawl  . Insomnia 05/17/2014  . Measles as a child  . Palpitations 05/17/2015  . Seasonal allergies    Patient Active Problem List   Diagnosis Date Noted  . High serum high density lipoprotein (HDL) 05/24/2018  . Seasonal allergies, uses Allegra, Flonase, and Singulair 05/24/2018  . Dense breast tissue on mammogram, scans in November 05/24/2018  . Osteoporosis, Dx 07/31/16, DEXA, prefers no medications other than calcium + D 05/24/2018  . Vitamin D deficiency 05/24/2017  . IBS (irritable bowel syndrome), constipation predominant 05/17/2014  . Insomnia 05/17/2014  Past Surgical History:  Procedure Laterality Date  . BREAST BIOPSY    . BREAST EXCISIONAL BIOPSY    . BREAST SURGERY Bilateral    Lumpectomy  . DILATION AND CURETTAGE OF UTERUS  1983    Missed AB  . [place]  . WISDOM TOOTH EXTRACTION  1972    Family History  Problem Relation Age of Onset  . Hypertension Father   . Hyperlipidemia Father   . Heart failure Father   . Atrial fibrillation Father   . Cancer Father        lung, soft tissue in hand, thigh, basal cell in ear- smoker then quit  . Atrial fibrillation Mother   . Dementia Mother   . Hypertension Son   . Cancer Maternal Grandmother        ? breast cancer- masectomy  . Breast cancer Maternal Grandmother   . Heart disease Paternal Grandmother   . Heart disease Paternal Grandfather   . Depression Daughter   . Heart attack Neg Hx   . Sudden death Neg Hx     Medications- reviewed and updated Current Outpatient Medications  Medication Sig Dispense Refill  . Calcium Carb-Cholecalciferol (CALCIUM + D3) 600-200 MG-UNIT TABS Take by mouth.    . fexofenadine (ALLEGRA) 180 MG tablet Take 180 mg by mouth daily as needed.     . fluticasone (FLONASE) 50 MCG/ACT nasal spray Place 2 sprays into both nostrils daily. 16 g 6  . ibuprofen (ADVIL,MOTRIN) 200 MG tablet Take 200 mg by mouth every 6 (six) hours as needed.    . montelukast (SINGULAIR) 10 MG tablet Take 1 tablet (10 mg total) by mouth at bedtime. 30 tablet 3  . Multiple Vitamin (MULTIVITAMIN) tablet Take 1 tablet by mouth daily.    . polyethylene glycol powder (GLYCOLAX/MIRALAX) powder Take 1 capful twice daily    . ranitidine (ZANTAC) 150 MG tablet Take 1 tablet (150 mg total) by mouth 2 (two) times daily. 30 tablet 0   No current facility-administered medications for this visit.     Allergies-reviewed and updated Allergies  Allergen Reactions  . Pollen Extract Other (See Comments)    Social History   Socioeconomic History  . Marital status: Married    Spouse name: Not on file  . Number of children: 5  . Years of education: Nursing   . Highest education level: Not on file  Occupational History    Employer: [employer]  .  Occupation: Programmer, multimedia: [employer]  . Financial resource strain: Not on file  . Food insecurity    Worry: Not on file    Inability: Not on file  . Transportation needs    Medical: Not on file    Non-medical: Not on file  Tobacco Use  . Smoking status: Never Smoker  . Smokeless tobacco: Never Used  Substance and Sexual Activity  . Alcohol use: Yes    Comment: Wine on weekends  . Drug use: No  . 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 Physical activity    Days per week: Not on file    Minutes per session: Not on file  . Stress: Not on file  Relationships  . Social Herbalist on phone: Not on file    Gets together: Not on  file    Attends religious service: Not on file    Active member of club or organization: Not on file    Attends meetings of clubs or organizations: Not on file    Relationship status: Not on file  Other Topics Concern  . Not on file  Social History Narrative  . Not on file        Objective:  Physical Exam: BP 118/68   Pulse (!) 56   Temp 98.2 F (36.8 C)   Ht 5' 3.5" (1.613 m)   Wt 111 lb 8 oz (50.6 kg)   SpO2 99%   BMI 19.44 kg/m   Body mass index is 19.44 kg/m. Wt Readings from Last 3 Encounters:  05/27/19 111 lb 8 oz (50.6 kg)  10/23/18 115 lb (52.2 kg)  09/13/18 117 lb 3.2 oz (53.2 kg)   Gen: NAD, resting comfortably HEENT: TMs normal bilaterally. OP clear. No thyromegaly noted.  CV: RRR with no murmurs appreciated Pulm: NWOB, CTAB with no crackles, wheezes, or rhonchi GI: Normal bowel sounds present. Soft, Nontender, Nondistended. MSK: no edema, cyanosis, or clubbing noted Skin: warm, dry Neuro: CN2-12 grossly intact. Strength 5/5 in upper and lower extremities. Reflexes symmetric and intact bilaterally.  Psych: Normal affect and thought content     Caleb M. Jerline Pain, MD  05/27/2019 8:35 AM

## 2019-05-27 NOTE — Patient Instructions (Signed)
It was very nice to see you today!  We will give you your second pneumonia vaccine today.  We will check blood work today.  Please come except in 1 year for your next physical, or sooner if needed.  Take care, Dr Jerline Pain  Please try these tips to maintain a healthy lifestyle:   Eat at least 3 REAL meals and 1-2 snacks per day.  Aim for no more than 5 hours between eating.  If you eat breakfast, please do so within one hour of getting up.    Obtain twice as many fruits/vegetables as protein or carbohydrate foods for both lunch and dinner. (Half of each meal should be fruits/vegetables, one quarter protein, and one quarter starchy carbs)   Cut down on sweet beverages. This includes juice, soda, and sweet tea.    Exercise at least 150 minutes every week.    Preventive Care 2 Years and Older, Female Preventive care refers to lifestyle choices and visits with your health care provider that can promote health and wellness. This includes:  A yearly physical exam. This is also called an annual well check.  Regular dental and eye exams.  Immunizations.  Screening for certain conditions.  Healthy lifestyle choices, such as diet and exercise. What can I expect for my preventive care visit? Physical exam Your health care provider will check:  Height and weight. These may be used to calculate body mass index (BMI), which is a measurement that tells if you are at a healthy weight.  Heart rate and blood pressure.  Your skin for abnormal spots. Counseling Your health care provider may ask you questions about:  Alcohol, tobacco, and drug use.  Emotional well-being.  Home and relationship well-being.  Sexual activity.  Eating habits.  History of falls.  Memory and ability to understand (cognition).  Work and work Statistician.  Pregnancy and menstrual history. What immunizations do I need?  Influenza (flu) vaccine  This is recommended every year. Tetanus,  diphtheria, and pertussis (Tdap) vaccine  You may need a Td booster every 10 years. Varicella (chickenpox) vaccine  You may need this vaccine if you have not already been vaccinated. Zoster (shingles) vaccine  You may need this after age 85. Pneumococcal conjugate (PCV13) vaccine  One dose is recommended after age 37. Pneumococcal polysaccharide (PPSV23) vaccine  One dose is recommended after age 42. Measles, mumps, and rubella (MMR) vaccine  You may need at least one dose of MMR if you were born in 1957 or later. You may also need a second dose. Meningococcal conjugate (MenACWY) vaccine  You may need this if you have certain conditions. Hepatitis A vaccine  You may need this if you have certain conditions or if you travel or work in places where you may be exposed to hepatitis A. Hepatitis B vaccine  You may need this if you have certain conditions or if you travel or work in places where you may be exposed to hepatitis B. Haemophilus influenzae type b (Hib) vaccine  You may need this if you have certain conditions. You may receive vaccines as individual doses or as more than one vaccine together in one shot (combination vaccines). Talk with your health care provider about the risks and benefits of combination vaccines. What tests do I need? Blood tests  Lipid and cholesterol levels. These may be checked every 5 years, or more frequently depending on your overall health.  Hepatitis C test.  Hepatitis B test. Screening  Lung cancer screening. You may have  this screening every year starting at age 58 if you have a 30-pack-year history of smoking and currently smoke or have quit within the past 15 years.  Colorectal cancer screening. All adults should have this screening starting at age 28 and continuing until age 59. Your health care provider may recommend screening at age 82 if you are at increased risk. You will have tests every 1-10 years, depending on your results and  the type of screening test.  Diabetes screening. This is done by checking your blood sugar (glucose) after you have not eaten for a while (fasting). You may have this done every 1-3 years.  Mammogram. This may be done every 1-2 years. Talk with your health care provider about how often you should have regular mammograms.  BRCA-related cancer screening. This may be done if you have a family history of breast, ovarian, tubal, or peritoneal cancers. Other tests  Sexually transmitted disease (STD) testing.  Bone density scan. This is done to screen for osteoporosis. You may have this done starting at age 52. Follow these instructions at home: Eating and drinking  Eat a diet that includes fresh fruits and vegetables, whole grains, lean protein, and low-fat dairy products. Limit your intake of foods with high amounts of sugar, saturated fats, and salt.  Take vitamin and mineral supplements as recommended by your health care provider.  Do not drink alcohol if your health care provider tells you not to drink.  If you drink alcohol: ? Limit how much you have to 0-1 drink a day. ? Be aware of how much alcohol is in your drink. In the [place] one drink equals one 12 oz bottle of beer (355 mL), one 5 oz glass of wine (148 mL), or one 1 oz glass of hard liquor (44 mL). Lifestyle  Take daily care of your teeth and gums.  Stay active. Exercise for at least 30 minutes on 5 or more days each week.  Do not use any products that contain nicotine or tobacco, such as cigarettes, e-cigarettes, and chewing tobacco. If you need help quitting, ask your health care provider.  If you are sexually active, practice safe sex. Use a condom or other form of protection in order to prevent STIs (sexually transmitted infections).  Talk with your health care provider about taking a low-dose aspirin or statin. What's next?  Go to your health care provider once a year for a well check visit.  Ask your health care  provider how often you should have your eyes and teeth checked.  Stay up to date on all vaccines. This information is not intended to replace advice given to you by your health care provider. Make sure you discuss any questions you have with your health care provider. Document Released: 08/13/2015 Document Revised: 07/11/2018 Document Reviewed: 07/11/2018 Elsevier Patient Education  2020 [employer].

## 2019-05-27 NOTE — Assessment & Plan Note (Signed)
Continue Allegra, Flonase, and Singulair as needed.

## 2019-05-27 NOTE — Assessment & Plan Note (Signed)
Check lipid panel, CBC, C met, TSH.

## 2019-05-29 ENCOUNTER — Encounter: Payer: Self-pay | Admitting: Family Medicine

## 2019-05-29 LAB — VITAMIN D 25 HYDROXY (VIT D DEFICIENCY, FRACTURES): VITD: 35.82 ng/mL (ref 30.00–100.00)

## 2019-05-29 LAB — TSH: TSH: 1.82 u[IU]/mL (ref 0.35–4.50)

## 2019-05-30 ENCOUNTER — Encounter: Payer: Self-pay | Admitting: Family Medicine

## 2019-05-30 NOTE — Progress Notes (Signed)
Please inform patient of the following:  Cholesterol levels are borderline but everything else is NORMAL. Do not need to start medications, but she should continue working on diet and exercise and we can recheck in a year or so.   Norma Greene. Jerline Pain, MD 05/30/2019 10:06 AM

## 2019-08-04 MED FILL — SM CLEARLAX POWDER: 17 | 90 days supply | Qty: 3060 | Fill #1

## 2019-09-17 DIAGNOSIS — Z01419 Encounter for gynecological examination (general) (routine) without abnormal findings: Secondary | ICD-10-CM | POA: Diagnosis not present

## 2019-09-17 DIAGNOSIS — N952 Postmenopausal atrophic vaginitis: Secondary | ICD-10-CM | POA: Diagnosis not present

## 2019-09-17 DIAGNOSIS — Z1231 Encounter for screening mammogram for malignant neoplasm of breast: Secondary | ICD-10-CM | POA: Diagnosis not present

## 2019-09-17 DIAGNOSIS — Z13820 Encounter for screening for osteoporosis: Secondary | ICD-10-CM | POA: Diagnosis not present

## 2019-09-17 DIAGNOSIS — Z681 Body mass index (BMI) 19 or less, adult: Secondary | ICD-10-CM | POA: Diagnosis not present

## 2019-09-17 MED FILL — ESTRADIOL 0.1 MG/GM CRM: 0.1 | 50 days supply | Qty: 43 | Fill #0

## 2019-11-12 ENCOUNTER — Other Ambulatory Visit: Payer: Self-pay

## 2019-11-12 ENCOUNTER — Encounter: Payer: Self-pay | Admitting: Family Medicine

## 2019-11-12 ENCOUNTER — Ambulatory Visit (INDEPENDENT_AMBULATORY_CARE_PROVIDER_SITE_OTHER): Payer: 59 | Admitting: Family Medicine

## 2019-11-12 VITALS — BP 118/76 | HR 56 | Temp 97.6°F | Ht 63.5 in | Wt 112.4 lb

## 2019-11-12 DIAGNOSIS — R21 Rash and other nonspecific skin eruption: Secondary | ICD-10-CM | POA: Diagnosis not present

## 2019-11-12 MED ORDER — VALACYCLOVIR HCL 1 G PO TABS: 1000.0000 mg | ORAL_TABLET | Freq: Every day | ORAL | 1 refills | Status: DC

## 2019-11-12 MED FILL — valACYclovir HCL 1 GM TABS: 1 | 30 days supply | Qty: 30 | Fill #0

## 2019-11-12 NOTE — Progress Notes (Signed)
   Norma Greene is a 69 y.o. female who presents today for an office visit.  Assessment/Plan:  New/Acute Problems: Rash Appearance consistent with herpes simplex.  Will start treatment with Valtrex even though symptoms have been going on for over a week at this point.  Recommended topical Dermoplast for pain control.  Differential also includes inflammatory bowel disease however much less likely given her age and no prior diagnosis.  Discussed reasons to return to care.  Follow-up as needed.    Subjective:  HPI:  Patient here with painful rash around rectum and anus for the past few weeks.  Has tried several products including zinc oxide with modest improvement.  Symptoms are improving a little bit today.  She was diagnosed with genital herpes about 30 to 40 years ago.  Has not had any recurrences since then.       Objective:  Physical Exam: BP 118/76   Pulse (!) 56   Temp 97.6 F (36.4 C)   Ht 5' 3.5" (1.613 m)   Wt 112 lb 6.1 oz (51 kg)   SpO2 97%   BMI 19.59 kg/m   Gen: No acute distress, resting comfortably Skin: Multiple irritated erythematous ulcers on anal mucosa.      Katina Degree. [name], MD 11/12/2019 8:29 AM

## 2019-11-12 NOTE — Patient Instructions (Signed)
It was very nice to see you today!  Please start the valtrex.  Let me know if not improving by next week.   Take care, Dr [name]  Please try these tips to maintain a healthy lifestyle:   Eat at least 3 REAL meals and 1-2 snacks per day.  Aim for no more than 5 hours between eating.  If you eat breakfast, please do so within one hour of getting up.    Each meal should contain half fruits/vegetables, one quarter protein, and one quarter carbs (no bigger than a computer mouse)   Cut down on sweet beverages. This includes juice, soda, and sweet tea.     Drink at least 1 glass of water with each meal and aim for at least 8 glasses per day   Exercise at least 150 minutes every week.

## 2019-12-02 MED FILL — SM CLEARLAX POWDER: 17 | 90 days supply | Qty: 3060 | Fill #2

## 2020-03-29 ENCOUNTER — Other Ambulatory Visit: Payer: Self-pay | Admitting: Family Medicine

## 2020-03-30 ENCOUNTER — Other Ambulatory Visit (HOSPITAL_COMMUNITY): Payer: Self-pay | Admitting: Family Medicine

## 2020-03-30 MED FILL — SM CLEARLAX POWDER: 17 | 90 days supply | Qty: 3060 | Fill #0

## 2020-03-30 NOTE — Telephone Encounter (Signed)
LAST APPOINTMENT DATE: 11/12/2019   NEXT APPOINTMENT DATE: 05/27/2020

## 2020-05-27 ENCOUNTER — Other Ambulatory Visit: Payer: Self-pay | Admitting: Family Medicine

## 2020-05-27 ENCOUNTER — Other Ambulatory Visit: Payer: Self-pay

## 2020-05-27 ENCOUNTER — Ambulatory Visit (INDEPENDENT_AMBULATORY_CARE_PROVIDER_SITE_OTHER): Payer: 59 | Admitting: Family Medicine

## 2020-05-27 ENCOUNTER — Encounter: Payer: Self-pay | Admitting: Family Medicine

## 2020-05-27 VITALS — BP 133/81 | HR 54 | Temp 98.3°F | Ht 62.75 in | Wt 110.8 lb

## 2020-05-27 DIAGNOSIS — R739 Hyperglycemia, unspecified: Secondary | ICD-10-CM | POA: Diagnosis not present

## 2020-05-27 DIAGNOSIS — E785 Hyperlipidemia, unspecified: Secondary | ICD-10-CM

## 2020-05-27 DIAGNOSIS — J302 Other seasonal allergic rhinitis: Secondary | ICD-10-CM | POA: Diagnosis not present

## 2020-05-27 DIAGNOSIS — Z0001 Encounter for general adult medical examination with abnormal findings: Secondary | ICD-10-CM

## 2020-05-27 DIAGNOSIS — E559 Vitamin D deficiency, unspecified: Secondary | ICD-10-CM

## 2020-05-27 NOTE — Progress Notes (Signed)
Chief Complaint:  Norma Greene is a 69 y.o. female who presents today for her annual comprehensive physical exam.    Assessment/Plan:  Chronic Problems Addressed Today: Seasonal allergies, uses Allegra, Flonase, and Singulair Stable. Continue allegra, flonase, and singulair as needed.   Dyslipidemia Stable.  Check lipids.  Vitamin D deficiency Check vitamin D.  Preventative Healthcare: UTD on vaccines and screenings.  Check CBC, CMET, TSH, lipid panel, A1c.  Patient Counseling(The following topics were reviewed and/or handout was given):  -Nutrition: Stressed importance of moderation in sodium/caffeine intake, saturated fat and cholesterol, caloric balance, sufficient intake of fresh fruits, vegetables, and fiber.  -Stressed the importance of regular exercise.   -Substance Abuse: Discussed cessation/primary prevention of tobacco, alcohol, or other drug use; driving or other dangerous activities under the influence; availability of treatment for abuse.   -Injury prevention: Discussed safety belts, safety helmets, smoke detector, smoking near bedding or upholstery.   -Sexuality: Discussed sexually transmitted diseases, partner selection, use of condoms, avoidance of unintended pregnancy and contraceptive alternatives.   -Dental health: Discussed importance of regular tooth brushing, flossing, and dental visits.  -Health maintenance and immunizations reviewed. Please refer to Health maintenance section.  Return to care in 1 year for next preventative visit.     Subjective:  HPI:  She has no acute complaints today.   Lifestyle Diet: None specific. Tries to get plenty of fruits and vegetables.  Exercise: Busy with work.   Depression screen PHQ 2/9 05/27/2020  Decreased Interest 0  Down, Depressed, Hopeless 0  PHQ - 2 Score 0  Difficult doing work/chores -   There are no preventive care reminders to display for this patient.   ROS: Per HPI, otherwise a complete review  of systems was negative.   PMH:  The following were reviewed and entered/updated in epic: Past Medical History:  Diagnosis Date  . Allergy-induced asthma   . Chicken pox as a child  . Headache 05/17/2014  . History of chicken pox   . Hyperlipidemia, mild 05/23/2015  . IBS (irritable bowel syndrome)    Dr. Kinnie Scales  . Insomnia 05/17/2014  . Measles as a child  . Palpitations 05/17/2015  . Seasonal allergies    Patient Active Problem List   Diagnosis Date Noted  . Dyslipidemia 05/24/2018  . Seasonal allergies, uses Allegra, Flonase, and Singulair 05/24/2018  . Dense breast tissue on mammogram, scans in November 05/24/2018  . Osteoporosis, Dx 07/31/16, DEXA, prefers no medications other than calcium + D 05/24/2018  . Vitamin D deficiency 05/24/2017  . IBS (irritable bowel syndrome), constipation predominant 05/17/2014  . Insomnia 05/17/2014   Past Surgical History:  Procedure Laterality Date  . BREAST BIOPSY    . BREAST EXCISIONAL BIOPSY    . BREAST SURGERY Bilateral    Lumpectomy  . DILATION AND CURETTAGE OF UTERUS  1983   Missed AB  . MANDIBLE SURGERY  1985 and 1996  . WISDOM TOOTH EXTRACTION  1972    Family History  Problem Relation Age of Onset  . Hypertension Father   . Hyperlipidemia Father   . Heart failure Father   . Atrial fibrillation Father   . Cancer Father        lung, soft tissue in hand, thigh, basal cell in ear- smoker then quit  . Atrial fibrillation Mother   . Dementia Mother   . Hypertension Son   . Cancer Maternal Grandmother        ? breast cancer- masectomy  . Breast cancer  Maternal Grandmother   . Heart disease Paternal Grandmother   . Heart disease Paternal Grandfather   . Depression Daughter   . Heart attack Neg Hx   . Sudden death Neg Hx     Medications- reviewed and updated Current Outpatient Medications  Medication Sig Dispense Refill  . Calcium Carb-Cholecalciferol (CALCIUM + D3) 600-200 MG-UNIT TABS Take by mouth.    .  fexofenadine (ALLEGRA) 180 MG tablet Take 180 mg by mouth daily as needed.     . fluticasone (FLONASE) 50 MCG/ACT nasal spray Place 2 sprays into both nostrils daily. 16 g 6  . ibuprofen (ADVIL,MOTRIN) 200 MG tablet Take 200 mg by mouth every 6 (six) hours as needed.    . montelukast (SINGULAIR) 10 MG tablet Take 1 tablet (10 mg total) by mouth at bedtime. 30 tablet 3  . Multiple Vitamin (MULTIVITAMIN) tablet Take 1 tablet by mouth daily.    . SM CLEARLAX 17 GM/SCOOP powder MIX 1 CAPFUL IN FLUID AND DRINK BY MOUTH TWICE A DAY 3060 g 2  . valACYclovir (VALTREX) 1000 MG tablet Take 1 tablet (1,000 mg total) by mouth daily. Take 1 tablet daily for 5-7 days at first sign of outbreak. 30 tablet 1   No current facility-administered medications for this visit.    Allergies-reviewed and updated Allergies  Allergen Reactions  . Pollen Extract Other (See Comments)    Social History   Socioeconomic History  . Marital status: Married    Spouse name: Not on file  . Number of children: 5  . Years of education: Nursing [place]  . Highest education level: Not on file  Occupational History    Employer: [employer]  . Occupation: Teacher, adult education: [employer]  Tobacco Use  . Smoking status: Never Smoker  . Smokeless tobacco: Never Used  Substance and Sexual Activity  . Alcohol use: Yes    Comment: Wine on weekends  . Drug use: No  . 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 Not on file  Social History Narrative  . Not on file   Social Determinants of Health   Financial Resource Strain:   . Difficulty of Paying Living Expenses: Not on file  Food Insecurity:   . Worried About Programme researcher, broadcasting/film/video in the Last Year: Not on file  . Ran Out of Food in the Last Year: Not on file  Transportation Needs:   . Lack of Transportation (Medical): Not on file  . Lack of  Transportation (Non-Medical): Not on file  Physical Activity:   . Days of Exercise per Week: Not on file  . Minutes of Exercise per Session: Not on file  Stress:   . Feeling of Stress : Not on file  Social Connections:   . Frequency of Communication with Friends and Family: Not on file  . Frequency of Social Gatherings with Friends and Family: Not on file  . Attends Religious Services: Not on file  . Active Member of Clubs or Organizations: Not on file  . Attends Banker Meetings: Not on file  . Marital Status: Not on file        Objective:  Physical Exam: BP 133/81   Pulse (!) 54   Temp 98.3 F (36.8 C) (Temporal)   Ht 5' 2.75" (1.594 m)   Wt 110 lb  12.8 oz (50.3 kg)   SpO2 99%   BMI 19.78 kg/m   Body mass index is 19.78 kg/m. Wt Readings from Last 3 Encounters:  05/27/20 110 lb 12.8 oz (50.3 kg)  11/12/19 112 lb 6.1 oz (51 kg)  05/27/19 111 lb 8 oz (50.6 kg)   Gen: NAD, resting comfortably HEENT: TMs normal bilaterally. OP clear. No thyromegaly noted.  CV: RRR with no murmurs appreciated Pulm: NWOB, CTAB with no crackles, wheezes, or rhonchi GI: Normal bowel sounds present. Soft, Nontender, Nondistended. MSK: no edema, cyanosis, or clubbing noted Skin: warm, dry Neuro: CN2-12 grossly intact. Strength 5/5 in upper and lower extremities. Reflexes symmetric and intact bilaterally.  Psych: Normal affect and thought content     Donovan Persley M. [name], MD 05/27/2020 8:44 AM

## 2020-05-27 NOTE — Patient Instructions (Addendum)
It was very nice to see you today!  We will check blood work today.  Come back in 1 year for your next physical.  Please come back to see me sooner if needed.  Take care, Dr Jerline Pain  Please try these tips to maintain a healthy lifestyle:   Eat at least 3 REAL meals and 1-2 snacks per day.  Aim for no more than 5 hours between eating.  If you eat breakfast, please do so within one hour of getting up.    Each meal should contain half fruits/vegetables, one quarter protein, and one quarter carbs (no bigger than a computer mouse)   Cut down on sweet beverages. This includes juice, soda, and sweet tea.     Drink at least 1 glass of water with each meal and aim for at least 8 glasses per day   Exercise at least 150 minutes every week.    Preventive Care 69 Years and Older, Female Preventive care refers to lifestyle choices and visits with your health care provider that can promote health and wellness. This includes:  A yearly physical exam. This is also called an annual well check.  Regular dental and eye exams.  Immunizations.  Screening for certain conditions.  Healthy lifestyle choices, such as diet and exercise. What can I expect for my preventive care visit? Physical exam Your health care provider will check:  Height and weight. These may be used to calculate body mass index (BMI), which is a measurement that tells if you are at a healthy weight.  Heart rate and blood pressure.  Your skin for abnormal spots. Counseling Your health care provider may ask you questions about:  Alcohol, tobacco, and drug use.  Emotional well-being.  Home and relationship well-being.  Sexual activity.  Eating habits.  History of falls.  Memory and ability to understand (cognition).  Work and work Statistician.  Pregnancy and menstrual history. What immunizations do I need?  Influenza (flu) vaccine  This is recommended every year. Tetanus, diphtheria, and pertussis  (Tdap) vaccine  You may need a Td booster every 10 years. Varicella (chickenpox) vaccine  You may need this vaccine if you have not already been vaccinated. Zoster (shingles) vaccine  You may need this after age 36. Pneumococcal conjugate (PCV13) vaccine  One dose is recommended after age 78. Pneumococcal polysaccharide (PPSV23) vaccine  One dose is recommended after age 68. Measles, mumps, and rubella (MMR) vaccine  You may need at least one dose of MMR if you were born in 1957 or later. You may also need a second dose. Meningococcal conjugate (MenACWY) vaccine  You may need this if you have certain conditions. Hepatitis A vaccine  You may need this if you have certain conditions or if you travel or work in places where you may be exposed to hepatitis A. Hepatitis B vaccine  You may need this if you have certain conditions or if you travel or work in places where you may be exposed to hepatitis B. Haemophilus influenzae type b (Hib) vaccine  You may need this if you have certain conditions. You may receive vaccines as individual doses or as more than one vaccine together in one shot (combination vaccines). Talk with your health care provider about the risks and benefits of combination vaccines. What tests do I need? Blood tests  Lipid and cholesterol levels. These may be checked every 5 years, or more frequently depending on your overall health.  Hepatitis C test.  Hepatitis B test. Screening  Lung cancer screening. You may have this screening every year starting at age 56 if you have a 30-pack-year history of smoking and currently smoke or have quit within the past 15 years.  Colorectal cancer screening. All adults should have this screening starting at age 27 and continuing until age 107. Your health care provider may recommend screening at age 61 if you are at increased risk. You will have tests every 1-10 years, depending on your results and the type of screening  test.  Diabetes screening. This is done by checking your blood sugar (glucose) after you have not eaten for a while (fasting). You may have this done every 1-3 years.  Mammogram. This may be done every 1-2 years. Talk with your health care provider about how often you should have regular mammograms.  BRCA-related cancer screening. This may be done if you have a family history of breast, ovarian, tubal, or peritoneal cancers. Other tests  Sexually transmitted disease (STD) testing.  Bone density scan. This is done to screen for osteoporosis. You may have this done starting at age 25. Follow these instructions at home: Eating and drinking  Eat a diet that includes fresh fruits and vegetables, whole grains, lean protein, and low-fat dairy products. Limit your intake of foods with high amounts of sugar, saturated fats, and salt.  Take vitamin and mineral supplements as recommended by your health care provider.  Do not drink alcohol if your health care provider tells you not to drink.  If you drink alcohol: ? Limit how much you have to 0-1 drink a day. ? Be aware of how much alcohol is in your drink. In the [place] one drink equals one 12 oz bottle of beer (355 mL), one 5 oz glass of wine (148 mL), or one 1 oz glass of hard liquor (44 mL). Lifestyle  Take daily care of your teeth and gums.  Stay active. Exercise for at least 30 minutes on 5 or more days each week.  Do not use any products that contain nicotine or tobacco, such as cigarettes, e-cigarettes, and chewing tobacco. If you need help quitting, ask your health care provider.  If you are sexually active, practice safe sex. Use a condom or other form of protection in order to prevent STIs (sexually transmitted infections).  Talk with your health care provider about taking a low-dose aspirin or statin. What's next?  Go to your health care provider once a year for a well check visit.  Ask your health care provider how often you  should have your eyes and teeth checked.  Stay up to date on all vaccines. This information is not intended to replace advice given to you by your health care provider. Make sure you discuss any questions you have with your health care provider. Document Revised: 07/11/2018 Document Reviewed: 07/11/2018 Elsevier Patient Education  2020 [employer].

## 2020-05-27 NOTE — Assessment & Plan Note (Signed)
Stable.  Check lipids.

## 2020-05-27 NOTE — Assessment & Plan Note (Signed)
Check vitamin D. 

## 2020-05-27 NOTE — Assessment & Plan Note (Addendum)
Stable. Continue allegra, flonase, and singulair as needed.

## 2020-05-28 LAB — COMPREHENSIVE METABOLIC PANEL
AG Ratio: 1.8 (calc) (ref 1.0–2.5)
ALT: 13 U/L (ref 6–29)
AST: 15 U/L (ref 10–35)
Albumin: 4.2 g/dL (ref 3.6–5.1)
Alkaline phosphatase (APISO): 56 U/L (ref 37–153)
BUN: 22 mg/dL (ref 7–25)
CO2: 28 mmol/L (ref 20–32)
Calcium: 9.8 mg/dL (ref 8.6–10.4)
Chloride: 106 mmol/L (ref 98–110)
Creat: 0.93 mg/dL (ref 0.50–0.99)
Globulin: 2.4 g/dL (calc) (ref 1.9–3.7)
Glucose, Bld: 93 mg/dL (ref 65–99)
Potassium: 4.3 mmol/L (ref 3.5–5.3)
Sodium: 141 mmol/L (ref 135–146)
Total Bilirubin: 0.6 mg/dL (ref 0.2–1.2)
Total Protein: 6.6 g/dL (ref 6.1–8.1)

## 2020-05-28 LAB — CBC
HCT: 39.5 % (ref 35.0–45.0)
Hemoglobin: 13.9 g/dL (ref 11.7–15.5)
MCH: 31.4 pg (ref 27.0–33.0)
MCHC: 35.2 g/dL (ref 32.0–36.0)
MCV: 89.2 fL (ref 80.0–100.0)
MPV: 10.8 fL (ref 7.5–12.5)
Platelets: 296 10*3/uL (ref 140–400)
RBC: 4.43 10*6/uL (ref 3.80–5.10)
RDW: 12.1 % (ref 11.0–15.0)
WBC: 3.9 10*3/uL (ref 3.8–10.8)

## 2020-05-28 LAB — LIPID PANEL
Cholesterol: 183 mg/dL (ref <200)
HDL: 81 mg/dL (ref >=50)
LDL Cholesterol (Calc): 88 mg/dL (calc)
Non-HDL Cholesterol (Calc): 102 mg/dL (calc) (ref <130)
Total CHOL/HDL Ratio: 2.3 (calc) (ref <5.0)
Triglycerides: 62 mg/dL (ref <150)

## 2020-05-28 LAB — VITAMIN D 25 HYDROXY (VIT D DEFICIENCY, FRACTURES): Vit D, 25-Hydroxy: 22 ng/mL — ABNORMAL LOW (ref 30–100)

## 2020-05-28 LAB — TSH: TSH: 1.31 mIU/L (ref 0.40–4.50)

## 2020-05-28 LAB — HEMOGLOBIN A1C
Hgb A1c MFr Bld: 5.3 % of total Hgb (ref <5.7)
Mean Plasma Glucose: 105 (calc)
eAG (mmol/L): 5.8 (calc)

## 2020-06-01 NOTE — Progress Notes (Signed)
Please inform patient of the following:  Vit D low. Everything else is NORMAL. Recommend she take Vit D 50000IU weekly and we can recheck in 3-6 months.  Norma Greene. [name], MD 06/01/2020 1:02 PM

## 2020-06-09 ENCOUNTER — Other Ambulatory Visit: Payer: Self-pay | Admitting: *Deleted

## 2020-06-09 ENCOUNTER — Other Ambulatory Visit (HOSPITAL_COMMUNITY): Payer: Self-pay | Admitting: Family Medicine

## 2020-06-09 MED ORDER — ERGOCALCIFEROL 1.25 MG (50000 UT) PO CAPS: 50000.0000 [IU] | ORAL_CAPSULE | ORAL | 0 refills | Status: DC

## 2020-06-09 MED FILL — VIT D2 1.25 MG (50,000 UNIT: 1.25 MG | 84 days supply | Qty: 12 | Fill #0

## 2020-08-06 ENCOUNTER — Encounter: Payer: Self-pay | Admitting: Family Medicine

## 2020-08-09 MED FILL — POLYETHYLENE GLYCOL 3350 PO: 17 | 90 days supply | Qty: 3060 | Fill #1

## 2020-08-27 ENCOUNTER — Ambulatory Visit
Admission: RE | Admit: 2020-08-27 | Discharge: 2020-08-27 | Disposition: A | Discharge status: Home / Self Care | Payer: 59 | Source: Ambulatory Visit | Attending: Obstetrics and Gynecology | Admitting: Obstetrics and Gynecology

## 2020-08-27 ENCOUNTER — Encounter: Payer: Self-pay | Admitting: Family Medicine

## 2020-08-27 ENCOUNTER — Other Ambulatory Visit: Payer: Self-pay

## 2020-08-27 ENCOUNTER — Other Ambulatory Visit: Payer: Self-pay | Admitting: Obstetrics and Gynecology

## 2020-08-27 DIAGNOSIS — Z1231 Encounter for screening mammogram for malignant neoplasm of breast: Secondary | ICD-10-CM | POA: Diagnosis not present

## 2020-11-19 ENCOUNTER — Other Ambulatory Visit (HOSPITAL_COMMUNITY): Payer: Self-pay

## 2021-02-01 ENCOUNTER — Other Ambulatory Visit (HOSPITAL_COMMUNITY): Payer: Self-pay

## 2021-02-01 MED FILL — Polyethylene Glycol 3350 Oral Powder 17 GM/SCOOP: ORAL | Qty: 3060 | Fill #0 | Status: CN

## 2021-02-04 ENCOUNTER — Other Ambulatory Visit (HOSPITAL_COMMUNITY): Payer: Self-pay

## 2021-02-04 MED FILL — Polyethylene Glycol 3350 Oral Powder 17 GM/SCOOP: ORAL | 15 days supply | Qty: 510 | Fill #0 | Status: CN

## 2021-02-04 MED FILL — Polyethylene Glycol 3350 Oral Powder 17 GM/SCOOP: ORAL | 75 days supply | Qty: 2550 | Fill #0 | Status: CN

## 2021-02-04 MED FILL — Polyethylene Glycol 3350 Oral Powder 17 GM/SCOOP: ORAL | 75 days supply | Qty: 2550 | Fill #0 | Status: AC

## 2021-02-04 MED FILL — Polyethylene Glycol 3350 Oral Powder 17 GM/SCOOP: ORAL | 15 days supply | Qty: 510 | Fill #0 | Status: AC

## 2021-05-27 ENCOUNTER — Ambulatory Visit: Payer: PPO | Attending: Internal Medicine

## 2021-05-27 DIAGNOSIS — Z23 Encounter for immunization: Secondary | ICD-10-CM

## 2021-05-27 NOTE — Progress Notes (Signed)
   Covid-[facility] Vaccination [facility]  Name:  Norma Greene    MRN: [id number] DOB: [birth year]  05/27/2021  [name] was observed post Covid-19 immunization for 15 minutes without incident. She was provided with Vaccine Information Sheet and instruction to access the V-Safe system.   [name] was instructed to call 911 with any severe reactions post vaccine: Difficulty breathing  Swelling of face and throat  A fast heartbeat  A bad rash all over body  Dizziness and weakness   Immunizations Administered     Name Date Dose VIS Date Route   Pfizer Covid-19 Vaccine Bivalent Booster 05/27/2021 11:26 AM 0.3 mL 03/30/2021 Intramuscular   Manufacturer: [employer], [employer]   Lot: [id number]   NDC: [phone]

## 2021-05-31 ENCOUNTER — Other Ambulatory Visit: Payer: Self-pay

## 2021-05-31 ENCOUNTER — Encounter: Payer: Self-pay | Admitting: Family Medicine

## 2021-05-31 ENCOUNTER — Ambulatory Visit (INDEPENDENT_AMBULATORY_CARE_PROVIDER_SITE_OTHER): Payer: PPO | Admitting: Family Medicine

## 2021-05-31 VITALS — BP 120/78 | HR 55 | Temp 97.9°F | Ht 62.75 in | Wt 110.4 lb

## 2021-05-31 DIAGNOSIS — E559 Vitamin D deficiency, unspecified: Secondary | ICD-10-CM

## 2021-05-31 DIAGNOSIS — R739 Hyperglycemia, unspecified: Secondary | ICD-10-CM | POA: Diagnosis not present

## 2021-05-31 DIAGNOSIS — Z0001 Encounter for general adult medical examination with abnormal findings: Secondary | ICD-10-CM | POA: Diagnosis not present

## 2021-05-31 DIAGNOSIS — E785 Hyperlipidemia, unspecified: Secondary | ICD-10-CM | POA: Diagnosis not present

## 2021-05-31 DIAGNOSIS — M810 Age-related osteoporosis without current pathological fracture: Secondary | ICD-10-CM | POA: Diagnosis not present

## 2021-05-31 DIAGNOSIS — J302 Other seasonal allergic rhinitis: Secondary | ICD-10-CM | POA: Diagnosis not present

## 2021-05-31 DIAGNOSIS — F4329 Adjustment disorder with other symptoms: Secondary | ICD-10-CM | POA: Diagnosis not present

## 2021-05-31 LAB — LIPID PANEL
Cholesterol: 198 mg/dL (ref 0–200)
HDL: 85.7 mg/dL (ref >39.00)
LDL Cholesterol: 99 mg/dL (ref 0–99)
NonHDL: 112.33
Total CHOL/HDL Ratio: 2
Triglycerides: 65 mg/dL (ref 0.0–149.0)
VLDL: 13 mg/dL (ref 0.0–40.0)

## 2021-05-31 LAB — CBC
HCT: 39.1 % (ref 36.0–46.0)
Hemoglobin: 13 g/dL (ref 12.0–15.0)
MCHC: 33.3 g/dL (ref 30.0–36.0)
MCV: 90.7 fl (ref 78.0–100.0)
Platelets: 257 10*3/uL (ref 150.0–400.0)
RBC: 4.31 Mil/uL (ref 3.87–5.11)
RDW: 13.5 % (ref 11.5–15.5)
WBC: 3.1 10*3/uL — ABNORMAL LOW (ref 4.0–10.5)

## 2021-05-31 LAB — COMPREHENSIVE METABOLIC PANEL
ALT: 19 U/L (ref 0–35)
AST: 18 U/L (ref 0–37)
Albumin: 4.1 g/dL (ref 3.5–5.2)
Alkaline Phosphatase: 57 U/L (ref 39–117)
BUN: 21 mg/dL (ref 6–23)
CO2: 29 mEq/L (ref 19–32)
Calcium: 9.1 mg/dL (ref 8.4–10.5)
Chloride: 106 mEq/L (ref 96–112)
Creatinine, Ser: 0.83 mg/dL (ref 0.40–1.20)
GFR: 71.64 mL/min (ref >60.00)
Glucose, Bld: 101 mg/dL — ABNORMAL HIGH (ref 70–99)
Potassium: 3.9 mEq/L (ref 3.5–5.1)
Sodium: 141 mEq/L (ref 135–145)
Total Bilirubin: 0.6 mg/dL (ref 0.2–1.2)
Total Protein: 6.4 g/dL (ref 6.0–8.3)

## 2021-05-31 LAB — HEMOGLOBIN A1C: Hgb A1c MFr Bld: 5.4 % (ref 4.6–6.5)

## 2021-05-31 LAB — VITAMIN D 25 HYDROXY (VIT D DEFICIENCY, FRACTURES): VITD: 30.1 ng/mL (ref 30.00–100.00)

## 2021-05-31 LAB — TSH: TSH: 1.75 u[IU]/mL (ref 0.35–5.50)

## 2021-05-31 NOTE — Patient Instructions (Signed)
It was very nice to see you today!  We will check blood work today.  I will order a bone density scan.  We will refer you to a counselor.  I will see you back in a year for your next physical. Please come back to see me sooner if needed  Take care, Dr Jerline Pain  PLEASE NOTE:  If you had any lab tests please let us know if you have not heard back within a few days. You may see your results on mychart before we have a chance to review them but we will give you a call once they are reviewed by [place]. If we ordered any referrals today, please let us know if you have not heard from their office within the next week.   Please try these tips to maintain a healthy lifestyle:  Eat at least 3 REAL meals and 1-2 snacks per day.  Aim for no more than 5 hours between eating.  If you eat breakfast, please do so within one hour of getting up.   Each meal should contain half fruits/vegetables, one quarter protein, and one quarter carbs (no bigger than a computer mouse)  Cut down on sweet beverages. This includes juice, soda, and sweet tea.   Drink at least 1 glass of water with each meal and aim for at least 8 glasses per day  Exercise at least 150 minutes every week.    Preventive Care 25 Years and Older, Female Preventive care refers to lifestyle choices and visits with your health care provider that can promote health and wellness. This includes: A yearly physical exam. This is also called an annual wellness visit. Regular dental and eye exams. Immunizations. Screening for certain conditions. Healthy lifestyle choices, such as: Eating a healthy diet. Getting regular exercise. Not using drugs or products that contain nicotine and tobacco. Limiting alcohol use. What can I expect for my preventive care visit? Physical exam Your health care provider will check your: Height and weight. These may be used to calculate your BMI (body mass index). BMI is a measurement that tells if you are at a  healthy weight. Heart rate and blood pressure. Body temperature. Skin for abnormal spots. Counseling Your health care provider may ask you questions about your: Past medical problems. Family's medical history. Alcohol, tobacco, and drug use. Emotional well-being. Home life and relationship well-being. Sexual activity. Diet, exercise, and sleep habits. History of falls. Memory and ability to understand (cognition). Work and work Statistician. Pregnancy and menstrual history. Access to firearms. What immunizations do I need? Vaccines are usually given at various ages, according to a schedule. Your health care provider will recommend vaccines for you based on your age, medical history, and lifestyle or other factors, such as travel or where you work. What tests do I need? Blood tests Lipid and cholesterol levels. These may be checked every 5 years, or more often depending on your overall health. Hepatitis C test. Hepatitis B test. Screening Lung cancer screening. You may have this screening every year starting at age 28 if you have a 30-pack-year history of smoking and currently smoke or have quit within the past 15 years. Colorectal cancer screening. All adults should have this screening starting at age 37 and continuing until age 44. Your health care provider may recommend screening at age 47 if you are at increased risk. You will have tests every 1-10 years, depending on your results and the type of screening test. Diabetes screening. This is done by checking  your blood sugar (glucose) after you have not eaten for a while (fasting). You may have this done every 1-3 years. Mammogram. This may be done every 1-2 years. Talk with your health care provider about how often you should have regular mammograms. Abdominal aortic aneurysm (AAA) screening. You may need this if you are a current or former smoker. BRCA-related cancer screening. This may be done if you have a family history of  breast, ovarian, tubal, or peritoneal cancers. Other tests STD (sexually transmitted disease) testing, if you are at risk. Bone density scan. This is done to screen for osteoporosis. You may have this done starting at age 23. Talk with your health care provider about your test results, treatment options, and if necessary, the need for more tests. Follow these instructions at home: Eating and drinking  Eat a diet that includes fresh fruits and vegetables, whole grains, lean protein, and low-fat dairy products. Limit your intake of foods with high amounts of sugar, saturated fats, and salt. Take vitamin and mineral supplements as recommended by your health care provider. Do not drink alcohol if your health care provider tells you not to drink. If you drink alcohol: Limit how much you have to 0-1 drink a day. Be aware of how much alcohol is in your drink. In the [place] one drink equals one 12 oz bottle of beer (355 mL), one 5 oz glass of wine (148 mL), or one 1 oz glass of hard liquor (44 mL). Lifestyle Take daily care of your teeth and gums. Brush your teeth every morning and night with fluoride toothpaste. Floss one time each day. Stay active. Exercise for at least 30 minutes 5 or more days each week. Do not use any products that contain nicotine or tobacco, such as cigarettes, e-cigarettes, and chewing tobacco. If you need help quitting, ask your health care provider. Do not use drugs. If you are sexually active, practice safe sex. Use a condom or other form of protection in order to prevent STIs (sexually transmitted infections). Talk with your health care provider about taking a low-dose aspirin or statin. Find healthy ways to cope with stress, such as: Meditation, yoga, or listening to music. Journaling. Talking to a trusted person. Spending time with friends and family. Safety Always wear your seat belt while driving or riding in a vehicle. Do not drive: If you have been drinking  alcohol. Do not ride with someone who has been drinking. When you are tired or distracted. While texting. Wear a helmet and other protective equipment during sports activities. If you have firearms in your house, make sure you follow all gun safety procedures. What's next? Visit your health care provider once a year for an annual wellness visit. Ask your health care provider how often you should have your eyes and teeth checked. Stay up to date on all vaccines. This information is not intended to replace advice given to you by your health care provider. Make sure you discuss any questions you have with your health care provider. Document Revised: 09/24/2020 Document Reviewed: 07/11/2018 Elsevier Patient Education  2022 [employer].

## 2021-05-31 NOTE — Assessment & Plan Note (Signed)
Check labs 

## 2021-05-31 NOTE — Progress Notes (Signed)
Please inform patient of the following:  Labs are all stable. Would like for her to keep up the good work and we can recheck in a year.  Katina Degree. [name], MD 05/31/2021 12:51 PM

## 2021-05-31 NOTE — Assessment & Plan Note (Signed)
Stable. Continue Allegra, Flonase, and Singulair as needed.

## 2021-05-31 NOTE — Assessment & Plan Note (Signed)
Check vitamin D. 

## 2021-05-31 NOTE — Progress Notes (Signed)
Chief Complaint:  Norma Greene is a 70 y.o. female who presents today for her annual comprehensive physical exam.    Assessment/Plan:  New/Acute Problems: Adjustment disorder  Will place referral to therapy.  Chronic Problems Addressed Today: Osteoporosis, prefers no medications other than calcium + D Continue calcium and vitamin D supplementation.  We will check DEXA at the time of her next mammogram.  Seasonal allergies, uses Allegra, Flonase, and Singulair Stable. Continue Allegra, Flonase, and Singulair as needed.  Dyslipidemia Check labs.   Vitamin D deficiency Check vitamin D.   Preventative Healthcare: UTD on vaccines. Will get blood work done today. UTD on colonoscopy. Due for mammogram.   Patient Counseling(The following topics were reviewed and/or handout was given):  -Nutrition: Stressed importance of moderation in sodium/caffeine intake, saturated fat and cholesterol, caloric balance, sufficient intake of fresh fruits, vegetables, and fiber.  -Stressed the importance of regular exercise.   -Substance Abuse: Discussed cessation/primary prevention of tobacco, alcohol, or other drug use; driving or other dangerous activities under the influence; availability of treatment for abuse.   -Injury prevention: Discussed safety belts, safety helmets, smoke detector, smoking near bedding or upholstery.   -Sexuality: Discussed sexually transmitted diseases, partner selection, use of condoms, avoidance of unintended pregnancy and contraceptive alternatives.   -Dental health: Discussed importance of regular tooth brushing, flossing, and dental visits.  -Health maintenance and immunizations reviewed. Please refer to Health maintenance section.  Return to care in 1 year for next preventative visit.     Subjective:  HPI:  She has no acute complaints today.   She has been under stress due to her son in law committing suicide a couple of months ago. This has been very  difficult for her daughter and grandchildren.   Lifestyle Diet: Balanced. Try to eat plenty of fruits and vegetables.  Exercise: Walk  Depression screen [facility] 2/9 05/31/2021  Decreased Interest 0  Down, Depressed, Hopeless 0  PHQ - 2 Score 0  Difficult doing work/chores -    Health Maintenance Due  Topic Date Due   Zoster Vaccines- Shingrix (1 of 2) Never done   DEXA SCAN  06/01/2019     ROS: Per HPI, otherwise a complete review of systems was negative.   PMH:  The following were reviewed and entered/updated in epic: Past Medical History:  Diagnosis Date   Allergy-induced asthma    Chicken pox as a child   Headache 05/17/2014   History of chicken pox    Hyperlipidemia, mild 05/23/2015   IBS (irritable bowel syndrome)    Dr. Kinnie Scales   Insomnia 05/17/2014   Measles as a child   Palpitations 05/17/2015   Seasonal allergies    Patient Active Problem List   Diagnosis Date Noted   Dyslipidemia 05/24/2018   Seasonal allergies, uses Allegra, Flonase, and Singulair 05/24/2018   Dense breast tissue on mammogram, scans in November 05/24/2018   Osteoporosis, prefers no medications other than calcium + D 05/24/2018   Vitamin D deficiency 05/24/2017   IBS (irritable bowel syndrome), constipation predominant 05/17/2014   Insomnia 05/17/2014   Past Surgical History:  Procedure Laterality Date   BREAST BIOPSY     BREAST EXCISIONAL BIOPSY     BREAST SURGERY Bilateral    Lumpectomy   DILATION AND CURETTAGE OF UTERUS  1983   Missed AB   MANDIBLE SURGERY  1985 and 1996   WISDOM TOOTH EXTRACTION  1972    Family History  Problem Relation Age of Onset   Hypertension  Father    Hyperlipidemia Father    Heart failure Father    Atrial fibrillation Father    Cancer Father        lung, soft tissue in hand, thigh, basal cell in ear- smoker then quit   Atrial fibrillation Mother    Dementia Mother    Hypertension Son    Cancer Maternal Grandmother        ? breast cancer- masectomy    Breast cancer Maternal Grandmother    Heart disease Paternal Grandmother    Heart disease Paternal Grandfather    Depression Daughter    Heart attack Neg Hx    Sudden death Neg Hx     Medications- reviewed and updated Current Outpatient Medications  Medication Sig Dispense Refill   Calcium Carb-Cholecalciferol (CALCIUM + D3) 600-200 MG-UNIT TABS Take by mouth.     ergocalciferol (VITAMIN D2) 1.25 MG (50000 UT) capsule Take 1 capsule (50,000 Units total) by mouth once a week. 12 capsule 0   fexofenadine (ALLEGRA) 180 MG tablet Take 180 mg by mouth daily as needed.      fluticasone (FLONASE) 50 MCG/ACT nasal spray Place 2 sprays into both nostrils daily. 16 g 6   ibuprofen (ADVIL,MOTRIN) 200 MG tablet Take 200 mg by mouth every 6 (six) hours as needed.     montelukast (SINGULAIR) 10 MG tablet Take 1 tablet (10 mg total) by mouth at bedtime. 30 tablet 3   Multiple Vitamin (MULTIVITAMIN) tablet Take 1 tablet by mouth daily.     SM CLEARLAX 17 GM/SCOOP powder MIX 1 CAPFUL IN FLUID AND DRINK BY MOUTH TWICE A DAY 3060 g 2   valACYclovir (VALTREX) 1000 MG tablet Take 1 tablet (1,000 mg total) by mouth daily. Take 1 tablet daily for 5-7 days at first sign of outbreak. (Patient not taking: Reported on 05/31/2021) 30 tablet 1   No current facility-administered medications for this visit.    Allergies-reviewed and updated Allergies  Allergen Reactions   Pollen Extract Other (See Comments)    Social History   Socioeconomic History   Marital status: Married    Spouse name: Not on file   Number of children: 5   Years of education: Nursing [place]   Highest education level: Not on file  Occupational History    Employer: [employer]   Occupation: Teacher, adult education: [place]  Tobacco Use   Smoking status: Never   Smokeless tobacco: Never  Substance and Sexual Activity   Alcohol use: Yes    Comment: Wine on weekends   Drug use: No   Sexual activity: Yes    Partners: Male    Comment:  lives with husband and 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 Wt Readings from Last 3 Encounters:  05/31/21 110 lb 6.4 oz (50.1 kg)  05/27/20 110 lb 12.8 oz (50.3 kg)  11/12/19 112 lb 6.1 oz (51 kg)   Gen: NAD, resting comfortably HEENT: TMs normal bilaterally. OP clear. No thyromegaly noted.  CV: RRR with no murmurs appreciated Pulm: NWOB, CTAB with no crackles, wheezes, or rhonchi GI: Normal bowel sounds present. Soft, Nontender, Nondistended. MSK: no edema, cyanosis, or clubbing noted Skin: warm, dry Neuro: CN2-12 grossly intact. Strength 5/5 in upper and lower extremities. Reflexes symmetric and intact bilaterally.  Psych: Normal affect and thought content      I,Savera Zaman,acting as a scribe for Jacquiline Doe, MD.,have documented all relevant documentation on the behalf of Jacquiline Doe, MD,as directed by  Jacquiline Doe, MD while in the presence of Jacquiline Doe, MD.    I, Jacquiline Doe, MD, have reviewed all documentation for this visit. The documentation on 05/31/21 for the exam, diagnosis, procedures, and orders are all accurate and complete.  Katina Degree. [name], MD 05/31/2021 8:34 AM

## 2021-05-31 NOTE — Assessment & Plan Note (Signed)
Continue calcium and vitamin D supplementation.  We will check DEXA at the time of her next mammogram.

## 2021-06-20 ENCOUNTER — Other Ambulatory Visit (HOSPITAL_BASED_OUTPATIENT_CLINIC_OR_DEPARTMENT_OTHER): Payer: Self-pay

## 2021-06-20 MED ORDER — PFIZER COVID-19 VAC BIVALENT 30 MCG/0.3ML IM SUSP
INTRAMUSCULAR | 0 refills | Status: DC
  Filled 2021-06-20: qty 0.3, 1d supply, fill #0

## 2021-07-06 ENCOUNTER — Other Ambulatory Visit: Payer: Self-pay | Admitting: Family Medicine

## 2021-07-06 DIAGNOSIS — Z1231 Encounter for screening mammogram for malignant neoplasm of breast: Secondary | ICD-10-CM

## 2021-07-12 ENCOUNTER — Other Ambulatory Visit (HOSPITAL_COMMUNITY): Payer: Self-pay

## 2021-07-12 ENCOUNTER — Other Ambulatory Visit: Payer: Self-pay | Admitting: Family Medicine

## 2021-07-12 MED ORDER — POLYETHYLENE GLYCOL 3350 17 GM/SCOOP PO POWD
ORAL | 2 refills | Status: DC
  Filled 2021-07-12: qty 3060, fill #0
  Filled 2021-07-12 (×2): qty 3060, 90d supply, fill #0
  Filled 2021-11-07: qty 3060, 90d supply, fill #1
  Filled 2022-03-02 – 2022-03-03 (×2): qty 3060, 90d supply, fill #2

## 2021-07-13 ENCOUNTER — Other Ambulatory Visit (HOSPITAL_COMMUNITY): Payer: Self-pay

## 2021-07-15 ENCOUNTER — Other Ambulatory Visit (HOSPITAL_COMMUNITY): Payer: Self-pay

## 2021-11-07 ENCOUNTER — Other Ambulatory Visit (HOSPITAL_COMMUNITY): Payer: Self-pay

## 2021-11-08 ENCOUNTER — Other Ambulatory Visit (HOSPITAL_COMMUNITY): Payer: Self-pay

## 2021-11-09 ENCOUNTER — Other Ambulatory Visit (HOSPITAL_COMMUNITY): Payer: Self-pay

## 2021-11-29 ENCOUNTER — Other Ambulatory Visit: Payer: Self-pay | Admitting: Family Medicine

## 2021-11-29 DIAGNOSIS — Z0001 Encounter for general adult medical examination with abnormal findings: Secondary | ICD-10-CM

## 2021-11-29 DIAGNOSIS — M810 Age-related osteoporosis without current pathological fracture: Secondary | ICD-10-CM

## 2021-12-01 ENCOUNTER — Ambulatory Visit
Admission: RE | Admit: 2021-12-01 | Discharge: 2021-12-01 | Disposition: A | Discharge status: Home / Self Care | Payer: PPO | Source: Ambulatory Visit | Attending: Family Medicine | Admitting: Family Medicine

## 2021-12-01 DIAGNOSIS — M810 Age-related osteoporosis without current pathological fracture: Secondary | ICD-10-CM | POA: Diagnosis not present

## 2021-12-01 DIAGNOSIS — Z780 Asymptomatic menopausal state: Secondary | ICD-10-CM | POA: Diagnosis not present

## 2021-12-01 DIAGNOSIS — Z1231 Encounter for screening mammogram for malignant neoplasm of breast: Secondary | ICD-10-CM

## 2021-12-01 NOTE — Progress Notes (Signed)
Please inform patient of the following: ? ?Her bone density scan is worse than last time. Recommend she schedule an office visit to discuss medication to prevent fractures if she is interested.  ? ?Norma Greene. [name], MD ?12/01/2021 2:56 PM  ?

## 2022-01-09 ENCOUNTER — Other Ambulatory Visit (HOSPITAL_COMMUNITY): Payer: Self-pay

## 2022-01-09 DIAGNOSIS — H1045 Other chronic allergic conjunctivitis: Secondary | ICD-10-CM | POA: Diagnosis not present

## 2022-01-09 DIAGNOSIS — H0102B Squamous blepharitis left eye, upper and lower eyelids: Secondary | ICD-10-CM | POA: Diagnosis not present

## 2022-01-09 DIAGNOSIS — H40013 Open angle with borderline findings, low risk, bilateral: Secondary | ICD-10-CM | POA: Diagnosis not present

## 2022-01-09 DIAGNOSIS — H0102A Squamous blepharitis right eye, upper and lower eyelids: Secondary | ICD-10-CM | POA: Diagnosis not present

## 2022-01-09 MED ORDER — TIMOLOL MALEATE 0.5 % OP SOLN
1.0000 [drp] | Freq: Every day | OPHTHALMIC | 0 refills | Status: DC
  Filled 2022-01-09: qty 5, 50d supply, fill #0
  Filled 2022-03-02: qty 5, 50d supply, fill #1
  Filled 2022-04-27: qty 5, 50d supply, fill #2

## 2022-02-16 ENCOUNTER — Ambulatory Visit (INDEPENDENT_AMBULATORY_CARE_PROVIDER_SITE_OTHER): Payer: PPO | Admitting: Psychologist

## 2022-02-16 DIAGNOSIS — F330 Major depressive disorder, recurrent, mild: Secondary | ICD-10-CM

## 2022-02-16 NOTE — Plan of Care (Signed)

## 2022-02-16 NOTE — Progress Notes (Signed)
                Norma Wierman, PsyD 
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
## 2022-02-20 DIAGNOSIS — H0102A Squamous blepharitis right eye, upper and lower eyelids: Secondary | ICD-10-CM | POA: Diagnosis not present

## 2022-02-20 DIAGNOSIS — H40013 Open angle with borderline findings, low risk, bilateral: Secondary | ICD-10-CM | POA: Diagnosis not present

## 2022-02-20 DIAGNOSIS — H0102B Squamous blepharitis left eye, upper and lower eyelids: Secondary | ICD-10-CM | POA: Diagnosis not present

## 2022-02-20 DIAGNOSIS — H1045 Other chronic allergic conjunctivitis: Secondary | ICD-10-CM | POA: Diagnosis not present

## 2022-02-21 ENCOUNTER — Other Ambulatory Visit (HOSPITAL_COMMUNITY): Payer: Self-pay

## 2022-02-21 ENCOUNTER — Other Ambulatory Visit: Payer: Self-pay | Admitting: Family Medicine

## 2022-02-21 MED ORDER — VALACYCLOVIR HCL 1 G PO TABS
1000.0000 mg | ORAL_TABLET | Freq: Every day | ORAL | 1 refills | Status: AC
  Filled 2022-02-21: qty 30, 30d supply, fill #0

## 2022-02-24 ENCOUNTER — Ambulatory Visit (INDEPENDENT_AMBULATORY_CARE_PROVIDER_SITE_OTHER): Payer: PPO | Admitting: Psychologist

## 2022-02-24 DIAGNOSIS — F330 Major depressive disorder, recurrent, mild: Secondary | ICD-10-CM

## 2022-02-24 NOTE — Progress Notes (Signed)
                Norma Hornstein, PsyD 

## 2022-02-24 NOTE — Progress Notes (Signed)
[facility] Counselor/Therapist Progress Note  Patient ID: Norma Greene, MRN: [id number],    Date: 02/24/2022  Time Spent: 09:05 am to 09:43 am; total time: 38 minutes   This session was held via in person. The patient consented to in-person therapy and was in the clinician's office. Limits of confidentiality were discussed with the patient.   Treatment Type: Individual Therapy  Reported Symptoms: Anxiety and depression related to conflict with daughter and spouse  Mental Status Exam: Appearance:  Well Groomed     Behavior: Appropriate  Motor: Normal  Speech/Language:  Clear and Coherent  Affect: Appropriate  Mood: normal  Thought process: normal  Thought content:   WNL  Sensory/Perceptual disturbances:   WNL  Orientation: oriented to person, place, and time/date  Attention: Good  Concentration: Good  Memory: WNL  Fund of knowledge:  Good  Insight:   Fair  Judgment:  Good  Impulse Control: Good   Risk Assessment: Danger to Self:  No Self-injurious Behavior: No Danger to Others: No Duty to Warn:no Physical Aggression / Violence:No  Access to Firearms a concern: No  Gang Involvement:No   Subjective: Beginning the session, patient stated that not much has changed since the intake. After reviewing the treatment plan, patient voiced wanting to work on establishing boundaries. She explored the etiology of conflict contributing to wanting to establish boundaries. She also processed the idea of sitting with emotions. She explored ways to establish boundaries. She was agreeable to homework and following up. She denied suicidal and homicidal ideation.    Interventions:  Worked on developing a therapeutic relationship with the patient using active listening and reflective statements. Provided emotional support using empathy and validation. Reviewed the treatment plan with the patient. Reviewed events since the intake. Normalized and validated thoughts and emotions.  Identified goals for the session. Explored the etiology of conflict that patient experiences. Processed thoughts and emotions. Used socratic questions to assist the patient. Explored the idea of sitting with emotions. Validated concerns related to sitting with emotions. Provided psychoeducation. Processed thoughts and emotions. Assigned homework. Assessed for suicidal and homicidal ideation.   Homework: Listen to podcast episode on emotions  Next Session: Review homework and emotional support.   Diagnosis: F33.0 major depressive affective disorder, recurrent, mild   Plan:   Goals Alleviate depressive symptoms Recognize, accept, and cope with depressive feelings Develop healthy thinking patterns Develop healthy interpersonal relationships  Objectives target date for all objectives is 02/17/2023 Cooperate with a medication evaluation by a physician Verbalize an accurate understanding of depression Verbalize an understanding of the treatment Identify and replace thoughts that support depression Learn and implement behavioral strategies Verbalize an understanding and resolution of current interpersonal problems Learn and implement problem solving and decision making skills Learn and implement conflict resolution skills to resolve interpersonal problems Verbalize an understanding of healthy and unhealthy emotions verbalize insight into how past relationships may be influence current experiences with depression Use mindfulness and acceptance strategies and increase value based behavior  Increase hopeful statements about the future.  Interventions Consistent with treatment model, discuss how change in cognitive, behavioral, and interpersonal can help client alleviate depression CBT Behavioral activation help the client explore the relationship, nature of the dispute,  Help the client develop new interpersonal skills and relationships Conduct Problem so living therapy Teach conflict  resolution skills Use a process-experiential approach Conduct TLDP Conduct ACT Evaluate need for psychotropic medication Monitor adherence to medication   The patient and the clinician reviewed the treatment plan with  the patient. The patient approved of the treatment plan on 02/24/2022.  Hilbert Corrigan, PsyD

## 2022-03-02 ENCOUNTER — Other Ambulatory Visit (HOSPITAL_COMMUNITY): Payer: Self-pay

## 2022-03-03 ENCOUNTER — Other Ambulatory Visit (HOSPITAL_COMMUNITY): Payer: Self-pay

## 2022-03-06 ENCOUNTER — Other Ambulatory Visit (HOSPITAL_COMMUNITY): Payer: Self-pay

## 2022-03-13 ENCOUNTER — Other Ambulatory Visit (HOSPITAL_COMMUNITY): Payer: Self-pay

## 2022-03-14 IMAGING — MG MM DIGITAL SCREENING BILAT W/ TOMO AND CAD
8 series · 9 of 24 positions shown · non-contrast
Comparison: Previous exam(s).

CLINICAL DATA: Screening.

EXAM:
DIGITAL SCREENING BILATERAL MAMMOGRAM WITH TOMO AND CAD

[R CC synth-2D]
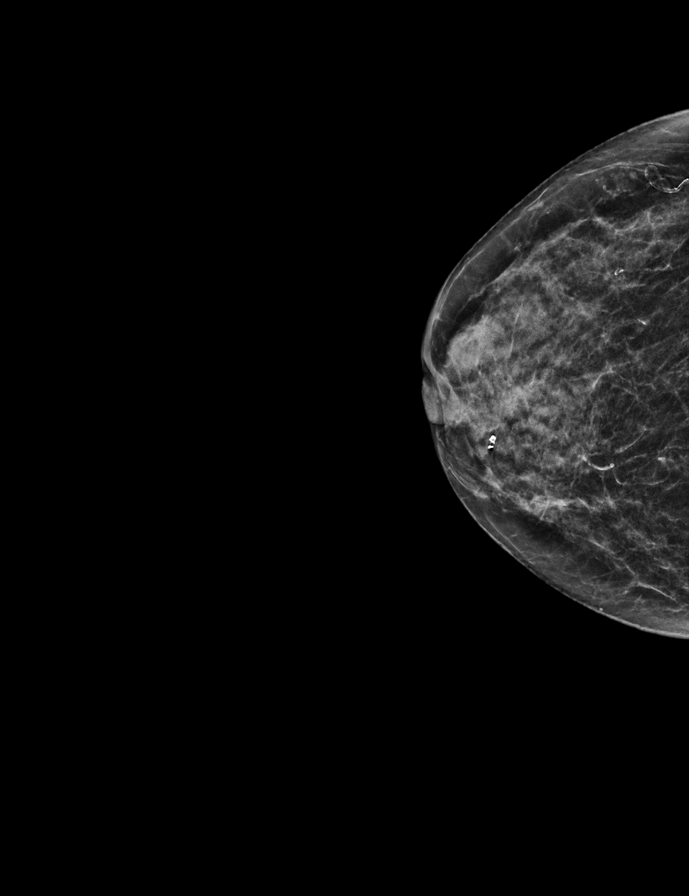

[L CC synth-2D]
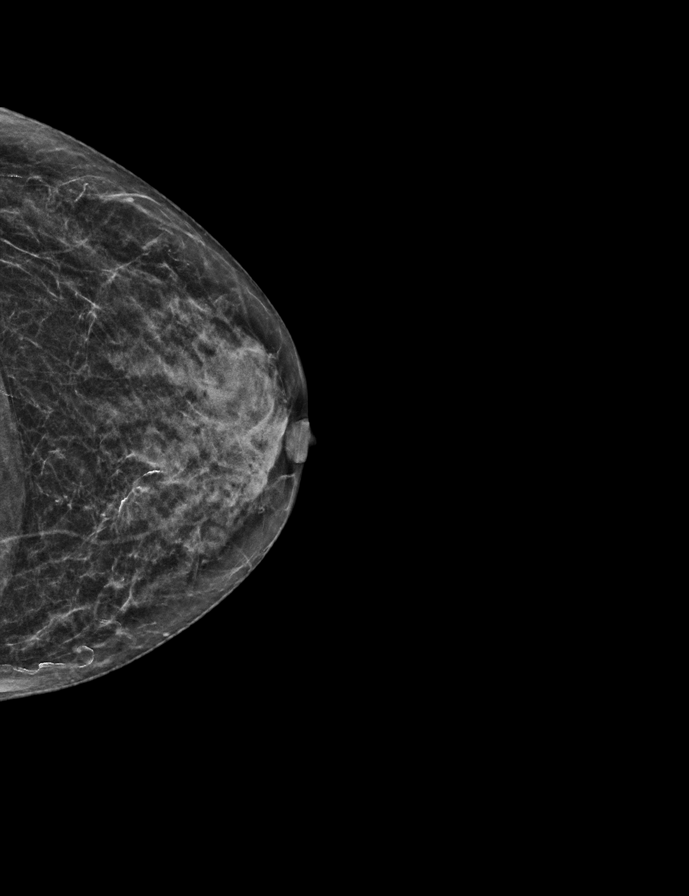

[L MLO synth-2D]
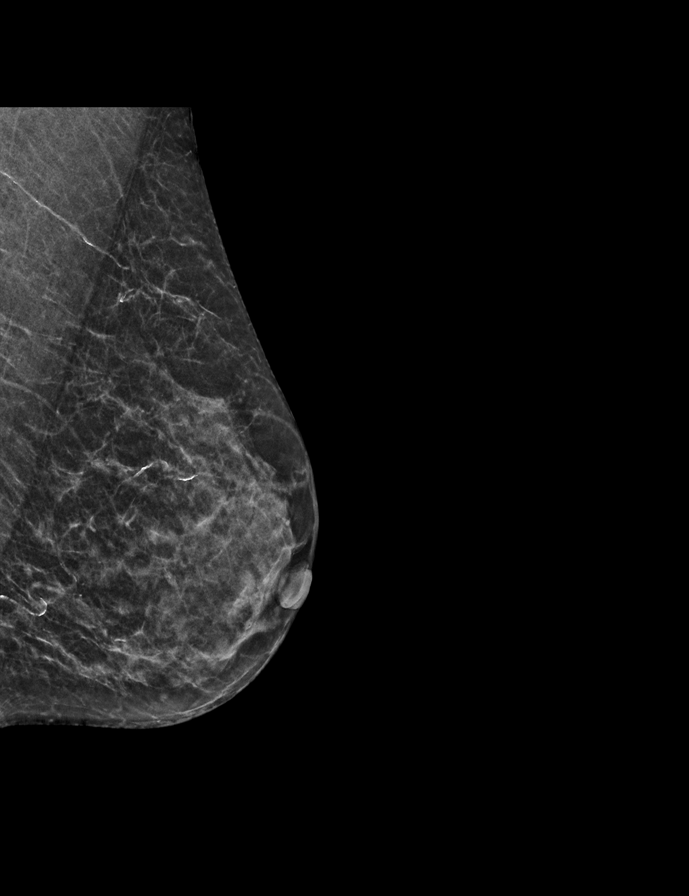

[R MLO synth-2D]
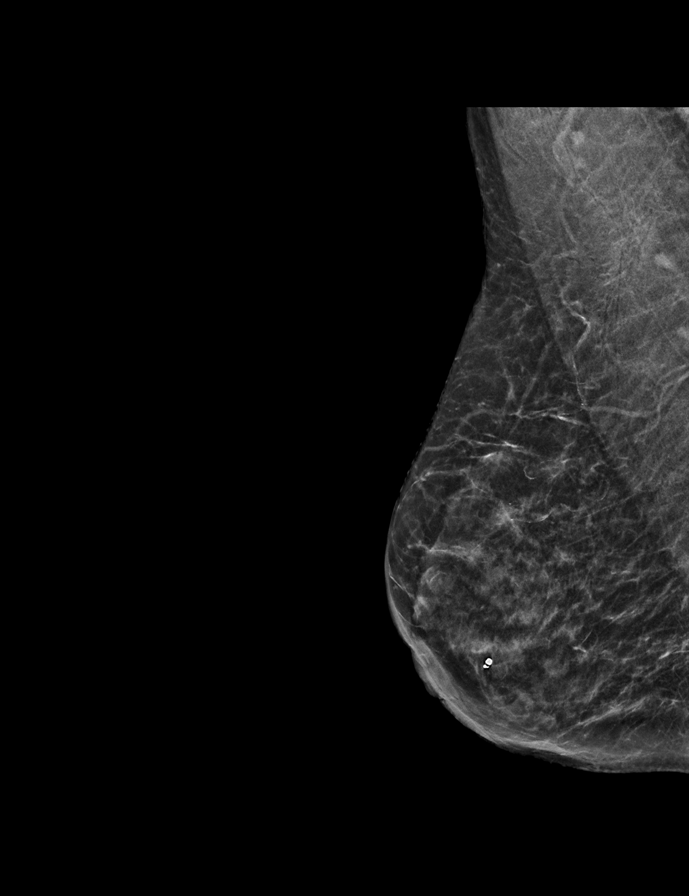

[L MLO tomo · 2 of 40 frames shown]
[frame 13/40]
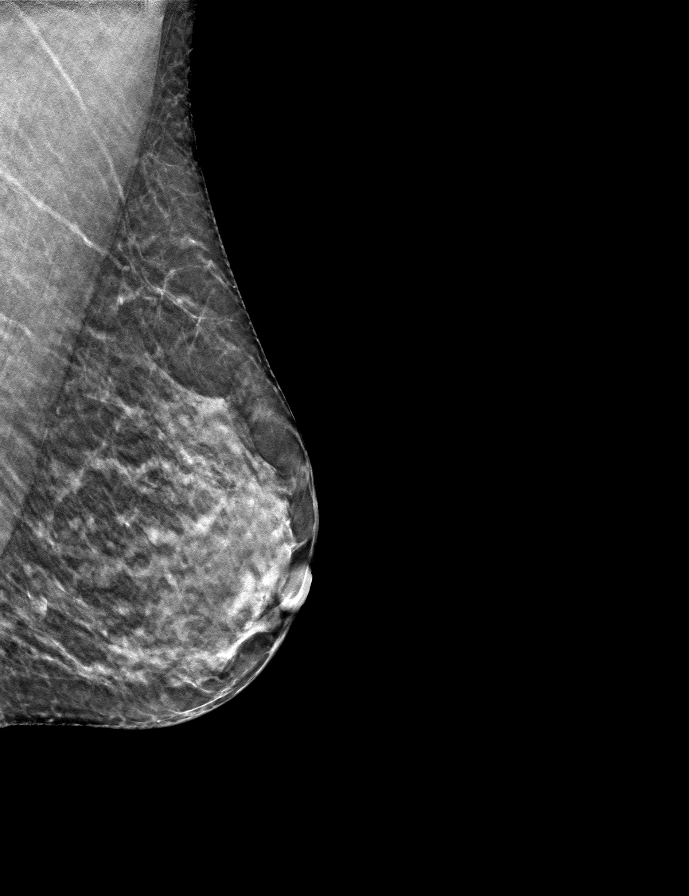
[frame 21/40]
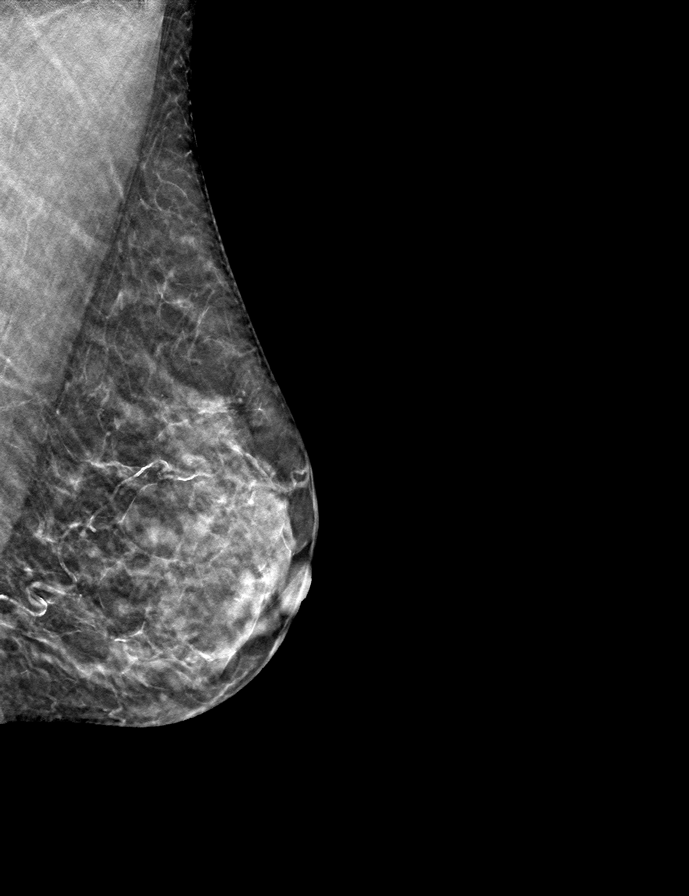

[R MLO tomo · tomo slice 23/46.0]
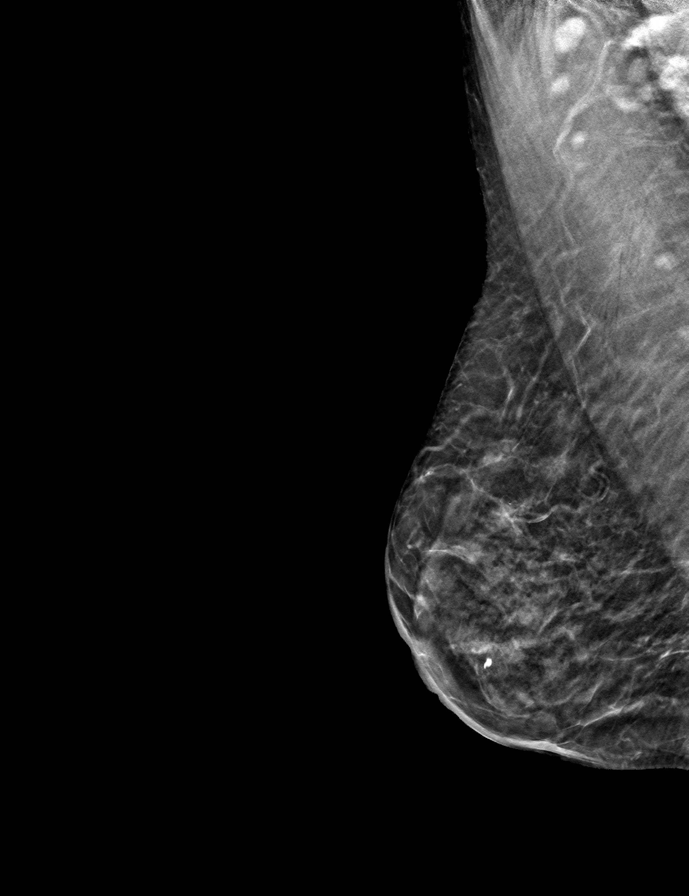

[L CC tomo · tomo slice 21/40.0]
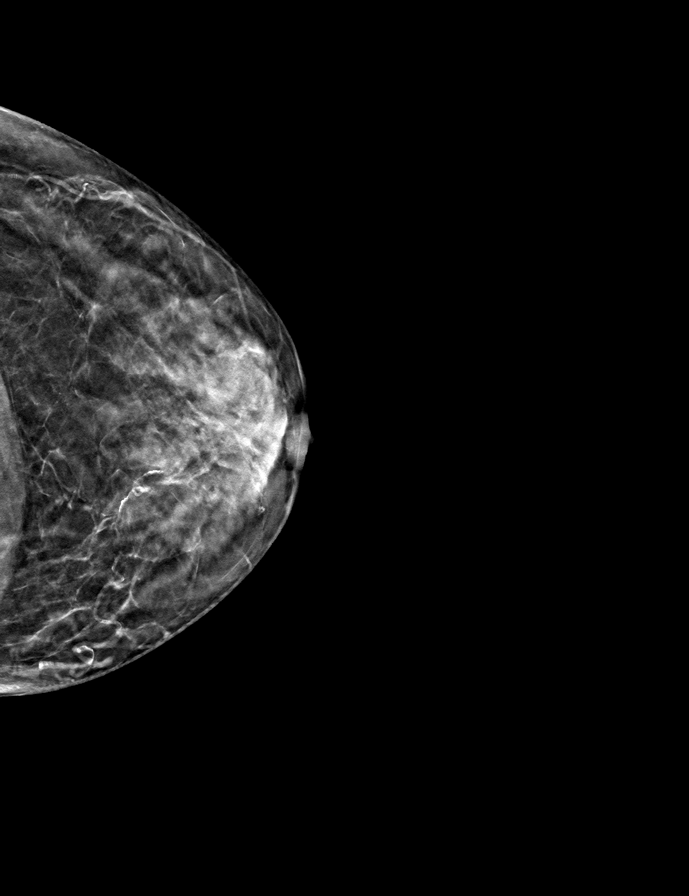

[R CC tomo · tomo slice 20/39.0]
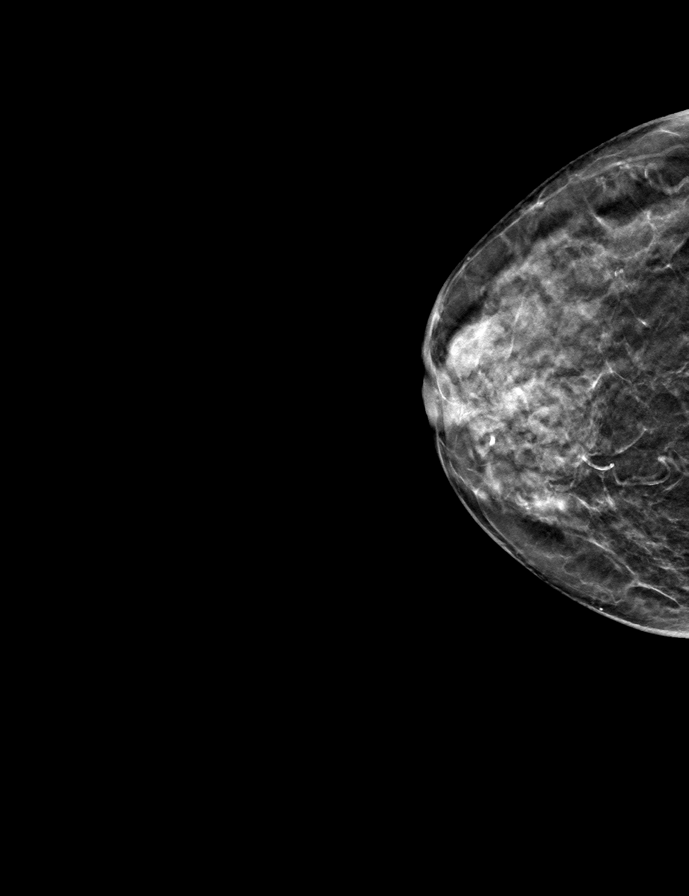

[9 of 24 positions shown; findings below may reference images not displayed]

ACR Breast Density Category c: The breast tissue is heterogeneously
dense, which may obscure small masses.
FINDINGS: There are no findings suspicious for malignancy. The images were
evaluated with computer-aided detection.
IMPRESSION: No mammographic evidence of malignancy. A result letter of this
screening mammogram will be mailed directly to the patient.

RECOMMENDATION:
Screening mammogram in one year. (Code:[id number]-[id number])

BI-RADS CATEGORY  1: Negative.

## 2022-03-16 ENCOUNTER — Ambulatory Visit (INDEPENDENT_AMBULATORY_CARE_PROVIDER_SITE_OTHER): Payer: PPO | Admitting: Psychologist

## 2022-03-16 DIAGNOSIS — F330 Major depressive disorder, recurrent, mild: Secondary | ICD-10-CM | POA: Diagnosis not present

## 2022-03-16 NOTE — Progress Notes (Signed)
[facility] Counselor/Therapist Progress Note  Patient ID: Norma Greene, MRN: [id number],    Date: 03/16/2022  Time Spent: 08:04 am to 08:42 am; total time: 38 minutes  This session was held via video webex teletherapy due to the coronavirus risk at this time. The patient consented to video teletherapy and was located at her home during this session. She is aware it is the responsibility of the patient to secure confidentiality on her end of the session. The provider was in a private home office for the duration of this session. Limits of confidentiality were discussed with the patient.   Treatment Type: Individual Therapy  Reported Symptoms: Distress related to conflict with spouse and children  Mental Status Exam: Appearance:  Well Groomed     Behavior: Appropriate  Motor: Normal  Speech/Language:  Clear and Coherent  Affect: Appropriate  Mood: normal  Thought process: normal  Thought content:   WNL  Sensory/Perceptual disturbances:   WNL  Orientation: oriented to person, place, and time/date  Attention: Good  Concentration: Good  Memory: WNL  Fund of knowledge:  Good  Insight:   Fair  Judgment:  Good  Impulse Control: Good   Risk Assessment: Danger to Self:  No Self-injurious Behavior: No Danger to Others: No Duty to Warn:no Physical Aggression / Violence:No  Access to Firearms a concern: No  Gang Involvement:No   Subjective: Beginning the session, patient described herself as okay and voiced that she had listened to the podcast episode. Elaborating, she stated she was unsure what to do with the information from the podcast. Per the patient, people around her tell her that her "perception is wrong". Patient explored how to get answers to questions. She processed thoughts and emotions. She recognized not getting support from home and talked about where she can get emotional support from in her life. She was agreeable to homework and following up. She  denied suicidal and homicidal ideation.    Interventions:  Worked on developing a therapeutic relationship with the patient using active listening and reflective statements. Provided emotional support using empathy and validation. Reviewed events since the last session. Praised patient for completing the homework assignment. Processed what the patient took away from the assignment. Used socratic questions to assist the patient gain insight into self. Explored if patient felt comfortable discussing concerns with family members. Validated concerns. Explored the idea of where patient can get emotional needs met if not from immediate family. Attempted to assist with problem solving. Processed thoughts and emotions. Provided empathic statements. Assigned homework. Assessed for suicidal and homicidal ideation.   Homework: Look into other areas of life where patient can get emotional support  Next Session: Review homework and emotional support.   Diagnosis: F33.0 major depressive affective disorder, recurrent, mild   Plan:   Goals Alleviate depressive symptoms Recognize, accept, and cope with depressive feelings Develop healthy thinking patterns Develop healthy interpersonal relationships  Objectives target date for all objectives is 02/17/2023 Cooperate with a medication evaluation by a physician Verbalize an accurate understanding of depression Verbalize an understanding of the treatment Identify and replace thoughts that support depression Learn and implement behavioral strategies Verbalize an understanding and resolution of current interpersonal problems Learn and implement problem solving and decision making skills Learn and implement conflict resolution skills to resolve interpersonal problems Verbalize an understanding of healthy and unhealthy emotions verbalize insight into how past relationships may be influence current experiences with depression Use mindfulness and acceptance  strategies and increase value based behavior  Increase hopeful statements about the future.  Interventions Consistent with treatment model, discuss how change in cognitive, behavioral, and interpersonal can help client alleviate depression CBT Behavioral activation help the client explore the relationship, nature of the dispute,  Help the client develop new interpersonal skills and relationships Conduct Problem so living therapy Teach conflict resolution skills Use a process-experiential approach Conduct TLDP Conduct ACT Evaluate need for psychotropic medication Monitor adherence to medication   The patient and the clinician reviewed the treatment plan with the patient. The patient approved of the treatment plan on 02/24/2022.  Conception Chancy, PsyD

## 2022-04-21 ENCOUNTER — Ambulatory Visit: Payer: PPO | Admitting: Psychologist

## 2022-04-24 ENCOUNTER — Encounter: Payer: Self-pay | Admitting: *Deleted

## 2022-04-27 ENCOUNTER — Other Ambulatory Visit (HOSPITAL_COMMUNITY): Payer: Self-pay

## 2022-05-05 ENCOUNTER — Telehealth: Payer: Self-pay | Admitting: Family Medicine

## 2022-05-05 NOTE — Telephone Encounter (Signed)
Copied from [place] [id number]. Topic: Medicare AWV >> May 05, 2022 11:08 AM Devoria Glassing wrote: Reason for CRM: Left message for patient to schedule Annual Wellness Visit.  Please schedule with Nurse Health Advisor Charlott Rakes, RN at [facility]. This appt can be telephone or office visit. Please call [phone] ask for [facility]

## 2022-05-17 ENCOUNTER — Encounter: Payer: Self-pay | Admitting: Family Medicine

## 2022-05-18 NOTE — Telephone Encounter (Signed)
Please reschedule CPE for patient 

## 2022-05-18 NOTE — Telephone Encounter (Signed)
Please reschedule CPE for patient

## 2022-06-01 ENCOUNTER — Encounter: Payer: PPO | Admitting: Family Medicine

## 2022-06-06 ENCOUNTER — Encounter: Payer: Self-pay | Admitting: Family Medicine

## 2022-06-06 ENCOUNTER — Ambulatory Visit (INDEPENDENT_AMBULATORY_CARE_PROVIDER_SITE_OTHER): Payer: PPO | Admitting: Family Medicine

## 2022-06-06 VITALS — BP 130/77 | HR 52 | Temp 97.5°F | Ht 62.8 in | Wt 106.4 lb

## 2022-06-06 DIAGNOSIS — G4700 Insomnia, unspecified: Secondary | ICD-10-CM | POA: Diagnosis not present

## 2022-06-06 DIAGNOSIS — J302 Other seasonal allergic rhinitis: Secondary | ICD-10-CM

## 2022-06-06 DIAGNOSIS — M810 Age-related osteoporosis without current pathological fracture: Secondary | ICD-10-CM

## 2022-06-06 DIAGNOSIS — Z0001 Encounter for general adult medical examination with abnormal findings: Secondary | ICD-10-CM | POA: Diagnosis not present

## 2022-06-06 DIAGNOSIS — E559 Vitamin D deficiency, unspecified: Secondary | ICD-10-CM | POA: Diagnosis not present

## 2022-06-06 DIAGNOSIS — E785 Hyperlipidemia, unspecified: Secondary | ICD-10-CM | POA: Diagnosis not present

## 2022-06-06 DIAGNOSIS — F439 Reaction to severe stress, unspecified: Secondary | ICD-10-CM

## 2022-06-06 DIAGNOSIS — R739 Hyperglycemia, unspecified: Secondary | ICD-10-CM

## 2022-06-06 LAB — CBC
HCT: 40.3 % (ref 36.0–46.0)
Hemoglobin: 13.5 g/dL (ref 12.0–15.0)
MCHC: 33.4 g/dL (ref 30.0–36.0)
MCV: 92 fl (ref 78.0–100.0)
Platelets: 266 10*3/uL (ref 150.0–400.0)
RBC: 4.38 Mil/uL (ref 3.87–5.11)
RDW: 13.8 % (ref 11.5–15.5)
WBC: 4.1 10*3/uL (ref 4.0–10.5)

## 2022-06-06 LAB — HEMOGLOBIN A1C: Hgb A1c MFr Bld: 5.5 % (ref 4.6–6.5)

## 2022-06-06 LAB — COMPREHENSIVE METABOLIC PANEL
ALT: 31 U/L (ref 0–35)
AST: 23 U/L (ref 0–37)
Albumin: 4.3 g/dL (ref 3.5–5.2)
Alkaline Phosphatase: 53 U/L (ref 39–117)
BUN: 18 mg/dL (ref 6–23)
CO2: 31 mEq/L (ref 19–32)
Calcium: 9.5 mg/dL (ref 8.4–10.5)
Chloride: 106 mEq/L (ref 96–112)
Creatinine, Ser: 0.84 mg/dL (ref 0.40–1.20)
GFR: 70.12 mL/min (ref >60.00)
Glucose, Bld: 95 mg/dL (ref 70–99)
Potassium: 4.9 mEq/L (ref 3.5–5.1)
Sodium: 142 mEq/L (ref 135–145)
Total Bilirubin: 0.6 mg/dL (ref 0.2–1.2)
Total Protein: 6.5 g/dL (ref 6.0–8.3)

## 2022-06-06 LAB — LIPID PANEL
Cholesterol: 213 mg/dL — ABNORMAL HIGH (ref 0–200)
HDL: 88.9 mg/dL (ref >39.00)
LDL Cholesterol: 107 mg/dL — ABNORMAL HIGH (ref 0–99)
NonHDL: 124.21
Total CHOL/HDL Ratio: 2
Triglycerides: 85 mg/dL (ref 0.0–149.0)
VLDL: 17 mg/dL (ref 0.0–40.0)

## 2022-06-06 LAB — TSH: TSH: 1.07 u[IU]/mL (ref 0.35–5.50)

## 2022-06-06 LAB — VITAMIN D 25 HYDROXY (VIT D DEFICIENCY, FRACTURES): VITD: 22.34 ng/mL — ABNORMAL LOW (ref 30.00–100.00)

## 2022-06-06 NOTE — Progress Notes (Signed)
Chief Complaint:  Norma Greene is a 71 y.o. female who presents today for her annual comprehensive physical exam.    Assessment/Plan:  Chronic Problems Addressed Today: Dyslipidemia Check lipids.  Osteoporosis, prefers no medications other than calcium + D, Tscore -3.3 11/2021 T score -3.5 on most recent DEXA scan.  She has tried Fosamax in the past but could not tolerate due to GI side effects.  We will start Prolia.  We can repeat bone density scan in 2 years.  Seasonal allergies, uses Allegra, Flonase, and Singulair Stable on over-the-counter allergy meds as needed.  Stress She has been under a lot of stress recently.  Husband recently lost job and she has had more financial burden.  She will be seeing therapist soon with [facility] to help with this.  Vitamin D deficiency Check vitamin D.  Preventative Healthcare: Check labs.  She will get shingles vaccine and COVID-vaccine at the pharmacy.  Patient Counseling(The following topics were reviewed and/or handout was given):  -Nutrition: Stressed importance of moderation in sodium/caffeine intake, saturated fat and cholesterol, caloric balance, sufficient intake of fresh fruits, vegetables, and fiber.  -Stressed the importance of regular exercise.   -Substance Abuse: Discussed cessation/primary prevention of tobacco, alcohol, or other drug use; driving or other dangerous activities under the influence; availability of treatment for abuse.   -Injury prevention: Discussed safety belts, safety helmets, smoke detector, smoking near bedding or upholstery.   -Sexuality: Discussed sexually transmitted diseases, partner selection, use of condoms, avoidance of unintended pregnancy and contraceptive alternatives.   -Dental health: Discussed importance of regular tooth brushing, flossing, and dental visits.  -Health maintenance and immunizations reviewed. Please refer to Health maintenance section.  Return to care in 1 year for next preventative  visit.     Subjective:  HPI:  She has no acute complaints today.      06/06/2022   12:54 PM  Depression screen PHQ 2/9  Decreased Interest 0  Down, Depressed, Hopeless 0  PHQ - 2 Score 0  Altered sleeping 0  Change in appetite 0  Feeling bad or failure about yourself  0  Trouble concentrating 0  Moving slowly or fidgety/restless 0  Suicidal thoughts 0  PHQ-9 Score 0  Difficult doing work/chores Not difficult at all    Health Maintenance Due  Topic Date Due   Medicare Annual Wellness (AWV)  Never done   Zoster Vaccines- Shingrix (1 of 2) Never done   COVID-19 Vaccine (5 - Pfizer series) 09/27/2021     ROS: Per HPI, otherwise a complete review of systems was negative.   PMH:  The following were reviewed and entered/updated in epic: Past Medical History:  Diagnosis Date   Allergy-induced asthma    Chicken pox as a child   Headache 05/17/2014   History of chicken pox    Hyperlipidemia, mild 05/23/2015   IBS (irritable bowel syndrome)    Dr. Kinnie Scales   Insomnia 05/17/2014   Measles as a child   Palpitations 05/17/2015   Seasonal allergies    Patient Active Problem List   Diagnosis Date Noted   Stress 06/06/2022   Dyslipidemia 05/24/2018   Seasonal allergies, uses Allegra, Flonase, and Singulair 05/24/2018   Dense breast tissue on mammogram, scans in November 05/24/2018   Osteoporosis, prefers no medications other than calcium + D, Tscore -3.3 11/2021 05/24/2018   Vitamin D deficiency 05/24/2017   IBS (irritable bowel syndrome), constipation predominant 05/17/2014   Insomnia 05/17/2014   Past Surgical History:  Procedure Laterality  Date   BREAST BIOPSY     BREAST EXCISIONAL BIOPSY     BREAST SURGERY Bilateral    Lumpectomy   DILATION AND CURETTAGE OF UTERUS  1983   Missed AB   MANDIBLE SURGERY  1985 and 1996   WISDOM TOOTH EXTRACTION  1972    Family History  Problem Relation Age of Onset   Hypertension Father    Hyperlipidemia Father    Heart  failure Father    Atrial fibrillation Father    Cancer Father        lung, soft tissue in hand, thigh, basal cell in ear- smoker then quit   Atrial fibrillation Mother    Dementia Mother    Hypertension Son    Cancer Maternal Grandmother        ? breast cancer- masectomy   Breast cancer Maternal Grandmother    Heart disease Paternal Grandmother    Heart disease Paternal Grandfather    Depression Daughter    Heart attack Neg Hx    Sudden death Neg Hx     Medications- reviewed and updated Current Outpatient Medications  Medication Sig Dispense Refill   Calcium Carb-Cholecalciferol (CALCIUM + D3) 600-200 MG-UNIT TABS Take by mouth.     ergocalciferol (VITAMIN D2) 1.25 MG (50000 UT) capsule Take 1 capsule (50,000 Units total) by mouth once a week. 12 capsule 0   fexofenadine (ALLEGRA) 180 MG tablet Take 180 mg by mouth daily as needed.      fluticasone (FLONASE) 50 MCG/ACT nasal spray Place 2 sprays into both nostrils daily. 16 g 6   ibuprofen (ADVIL,MOTRIN) 200 MG tablet Take 200 mg by mouth every 6 (six) hours as needed.     montelukast (SINGULAIR) 10 MG tablet Take 1 tablet (10 mg total) by mouth at bedtime. 30 tablet 3   Multiple Vitamin (MULTIVITAMIN) tablet Take 1 tablet by mouth daily.     polyethylene glycol powder (SM CLEARLAX) 17 GM/SCOOP powder MIX 1 CAPFUL IN FLUID AND DRINK BY MOUTH TWICE A DAY 3060 g 2   SM CLEARLAX 17 GM/SCOOP powder MIX 1 CAPFUL IN FLUID AND DRINK BY MOUTH TWICE A DAY 3060 g 2   timolol (TIMOPTIC) 0.5 % ophthalmic solution Place 1 drop into both eyes daily. 15 mL 0   valACYclovir (VALTREX) 1000 MG tablet Take 1 tablet (1,000 mg total) by mouth daily. Take 1 tablet daily for 5-7 days at first sign of outbreak. 30 tablet 1   No current facility-administered medications for this visit.    Allergies-reviewed and updated Allergies  Allergen Reactions   Pollen Extract Other (See Comments)    Social History   Socioeconomic History   Marital status:  Married    Spouse name: Not on file   Number of children: 5   Years of education: Nursing [place]   Highest education level: Not on file  Occupational History    Employer: [employer]   Occupation: Teacher, adult education: [place]  Tobacco Use   Smoking status: Never   Smokeless tobacco: Never  Substance and Sexual Activity   Alcohol use: Yes    Comment: Wine on weekends   Drug use: No   Sexual activity: Yes    Partners: Male    Comment: lives with husband and works for [employer] residency program at [employer],  no dietary restrictions, avoids milk  Other Topics Concern   Not on file  Social History Narrative   Not on file   Social Determinants of Health  Financial Resource Strain: Not on file  Food Insecurity: Not on file  Transportation Needs: Not on file  Physical Activity: Not on file  Stress: Not on file  Social Connections: Not on file        Objective:  Physical Exam: BP 130/77   Pulse (!) 52   Temp (!) 97.5 F (36.4 C) (Temporal)   Ht 5' 2.8" (1.595 m)   Wt 106 lb 6.4 oz (48.3 kg)   SpO2 98%   BMI 18.97 kg/m   Body mass index is 18.97 kg/m. Wt Readings from Last 3 Encounters:  06/06/22 106 lb 6.4 oz (48.3 kg)  05/31/21 110 lb 6.4 oz (50.1 kg)  05/27/20 110 lb 12.8 oz (50.3 kg)   Gen: NAD, resting comfortably HEENT: TMs normal bilaterally. OP clear. No thyromegaly noted.  CV: RRR with no murmurs appreciated Pulm: NWOB, CTAB with no crackles, wheezes, or rhonchi GI: Normal bowel sounds present. Soft, Nontender, Nondistended. MSK: no edema, cyanosis, or clubbing noted Skin: warm, dry Neuro: CN2-12 grossly intact. Strength 5/5 in upper and lower extremities. Reflexes symmetric and intact bilaterally.  Psych: Normal affect and thought content     Tashon Capp M. Jerline Pain, MD 06/06/2022 1:35 PM

## 2022-06-06 NOTE — Assessment & Plan Note (Signed)
Check lipids 

## 2022-06-06 NOTE — Assessment & Plan Note (Signed)
Check vitamin D. 

## 2022-06-06 NOTE — Assessment & Plan Note (Addendum)
She has been under a lot of stress recently.  Husband recently lost job and she has had more financial burden.  She will be seeing therapist soon with [facility] to help with this.

## 2022-06-06 NOTE — Patient Instructions (Signed)
It was very nice to see you today!  We will check blood work today.  We will get you set up for Prolia.  Please continue to work on diet and exercise.  I will see you back in a year for your next physical.  Come back sooner if needed.  Take care, Dr Parker  PLEASE NOTE:  If you had any lab tests please let us know if you have not heard back within a few days. You may see your results on mychart before we have a chance to review them but we will give you a call once they are reviewed by us. If we ordered any referrals today, please let us know if you have not heard from their office within the next week.   Please try these tips to maintain a healthy lifestyle:  Eat at least 3 REAL meals and 1-2 snacks per day.  Aim for no more than 5 hours between eating.  If you eat breakfast, please do so within one hour of getting up.   Each meal should contain half fruits/vegetables, one quarter protein, and one quarter carbs (no bigger than a computer mouse)  Cut down on sweet beverages. This includes juice, soda, and sweet tea.   Drink at least 1 glass of water with each meal and aim for at least 8 glasses per day  Exercise at least 150 minutes every week.    Preventive Care 65 Years and Older, Female Preventive care refers to lifestyle choices and visits with your health care provider that can promote health and wellness. Preventive care visits are also called wellness exams. What can I expect for my preventive care visit? Counseling Your health care provider may ask you questions about your: Medical history, including: Past medical problems. Family medical history. Pregnancy and menstrual history. History of falls. Current health, including: Memory and ability to understand (cognition). Emotional well-being. Home life and relationship well-being. Sexual activity and sexual health. Lifestyle, including: Alcohol, nicotine or tobacco, and drug use. Access to firearms. Diet, exercise,  and sleep habits. Work and work environment. Sunscreen use. Safety issues such as seatbelt and bike helmet use. Physical exam Your health care provider will check your: Height and weight. These may be used to calculate your BMI (body mass index). BMI is a measurement that tells if you are at a healthy weight. Waist circumference. This measures the distance around your waistline. This measurement also tells if you are at a healthy weight and may help predict your risk of certain diseases, such as type 2 diabetes and high blood pressure. Heart rate and blood pressure. Body temperature. Skin for abnormal spots. What immunizations do I need?  Vaccines are usually given at various ages, according to a schedule. Your health care provider will recommend vaccines for you based on your age, medical history, and lifestyle or other factors, such as travel or where you work. What tests do I need? Screening Your health care provider may recommend screening tests for certain conditions. This may include: Lipid and cholesterol levels. Hepatitis C test. Hepatitis B test. HIV (human immunodeficiency virus) test. STI (sexually transmitted infection) testing, if you are at risk. Lung cancer screening. Colorectal cancer screening. Diabetes screening. This is done by checking your blood sugar (glucose) after you have not eaten for a while (fasting). Mammogram. Talk with your health care provider about how often you should have regular mammograms. BRCA-related cancer screening. This may be done if you have a family history of breast, ovarian, tubal,   or peritoneal cancers. Bone density scan. This is done to screen for osteoporosis. Talk with your health care provider about your test results, treatment options, and if necessary, the need for more tests. Follow these instructions at home: Eating and drinking  Eat a diet that includes fresh fruits and vegetables, whole grains, lean protein, and low-fat dairy  products. Limit your intake of foods with high amounts of sugar, saturated fats, and salt. Take vitamin and mineral supplements as recommended by your health care provider. Do not drink alcohol if your health care provider tells you not to drink. If you drink alcohol: Limit how much you have to 0-1 drink a day. Know how much alcohol is in your drink. In the [place] one drink equals one 12 oz bottle of beer (355 mL), one 5 oz glass of wine (148 mL), or one 1 oz glass of hard liquor (44 mL). Lifestyle Brush your teeth every morning and night with fluoride toothpaste. Floss one time each day. Exercise for at least 30 minutes 5 or more days each week. Do not use any products that contain nicotine or tobacco. These products include cigarettes, chewing tobacco, and vaping devices, such as e-cigarettes. If you need help quitting, ask your health care provider. Do not use drugs. If you are sexually active, practice safe sex. Use a condom or other form of protection in order to prevent STIs. Take aspirin only as told by your health care provider. Make sure that you understand how much to take and what form to take. Work with your health care provider to find out whether it is safe and beneficial for you to take aspirin daily. Ask your health care provider if you need to take a cholesterol-lowering medicine (statin). Find healthy ways to manage stress, such as: Meditation, yoga, or listening to music. Journaling. Talking to a trusted person. Spending time with friends and family. Minimize exposure to UV radiation to reduce your risk of skin cancer. Safety Always wear your seat belt while driving or riding in a vehicle. Do not drive: If you have been drinking alcohol. Do not ride with someone who has been drinking. When you are tired or distracted. While texting. If you have been using any mind-altering substances or drugs. Wear a helmet and other protective equipment during sports activities. If you  have firearms in your house, make sure you follow all gun safety procedures. What's next? Visit your health care provider once a year for an annual wellness visit. Ask your health care provider how often you should have your eyes and teeth checked. Stay up to date on all vaccines. This information is not intended to replace advice given to you by your health care provider. Make sure you discuss any questions you have with your health care provider. Document Revised: 01/12/2021 Document Reviewed: 01/12/2021 Elsevier Patient Education  2023 Elsevier Inc.  

## 2022-06-06 NOTE — Assessment & Plan Note (Signed)
Stable on over-the-counter allergy meds as needed. ?

## 2022-06-06 NOTE — Assessment & Plan Note (Signed)
T score -3.5 on most recent DEXA scan.  She has tried Fosamax in the past but could not tolerate due to GI side effects.  We will start Prolia.  We can repeat bone density scan in 2 years.

## 2022-06-08 NOTE — Progress Notes (Signed)
Please inform patient of the following:  Vitamin D is low.  Recommend she start over-the-counter 5000 IUs once daily.  We should recheck this in 3 to 6 months.  All the other labs are stable.  Do not need to make any other changes to treatment plan this time.  She should continue to work on diet and exercise and we can recheck in a year.

## 2022-06-19 DIAGNOSIS — H40013 Open angle with borderline findings, low risk, bilateral: Secondary | ICD-10-CM | POA: Diagnosis not present

## 2022-06-27 ENCOUNTER — Encounter: Payer: Self-pay | Admitting: Family Medicine

## 2022-06-29 ENCOUNTER — Telehealth: Payer: Self-pay | Admitting: Family Medicine

## 2022-06-29 ENCOUNTER — Other Ambulatory Visit: Payer: Self-pay | Admitting: *Deleted

## 2022-06-29 DIAGNOSIS — M810 Age-related osteoporosis without current pathological fracture: Secondary | ICD-10-CM

## 2022-06-29 MED ORDER — DENOSUMAB 60 MG/ML ~~LOC~~ SOSY: 60.0000 mg | PREFILLED_SYRINGE | Freq: Once | SUBCUTANEOUS | Status: DC

## 2022-06-29 NOTE — Telephone Encounter (Signed)
Copied from CRM [id number]. Topic: Medicare AWV >> Jun 29, 2022 11:28 AM Gwenith Spitz wrote: Reason for CRM: lvm PATIENT TO CALL [phone] TO SCHEDULE AWVI DUE WITH HEALTH COACH TINA

## 2022-07-04 ENCOUNTER — Telehealth: Payer: Self-pay | Admitting: Family Medicine

## 2022-07-04 NOTE — Telephone Encounter (Signed)
Copied from CRM [id number]. Topic: Medicare AWV >> Jul 04, 2022 12:25 PM Gwenith Spitz wrote: Reason for CRM: LVM PT CALL [phone] SCHEDULE AWVI WITH [facility]

## 2022-07-07 ENCOUNTER — Telehealth: Payer: Self-pay | Admitting: Family Medicine

## 2022-07-07 NOTE — Telephone Encounter (Signed)
Patient returned call. Requests to be called at ph# [phone].  Also, requests status of PA for Osteopenia medication.

## 2022-07-07 NOTE — Telephone Encounter (Signed)
Left message to return call to our office at their convenience.  

## 2022-07-10 ENCOUNTER — Other Ambulatory Visit: Payer: Self-pay | Admitting: Family Medicine

## 2022-07-10 NOTE — Telephone Encounter (Signed)
Pt states: Results -She received the letter from PCP team about not being able to reach her. -She can be reached by MyChart message if the phone calls are not working. -She has seen the lab results from 06/27/22.  Prior Authorization -Asking about Prolia PA and timeline for getting it approved.   Pt Requests: -Call back with update about PA-Prolia

## 2022-07-11 ENCOUNTER — Other Ambulatory Visit (HOSPITAL_COMMUNITY): Payer: Self-pay

## 2022-07-11 MED ORDER — POLYETHYLENE GLYCOL 3350 17 GM/SCOOP PO POWD
ORAL | 2 refills | Status: DC
  Filled 2022-07-11: qty 3060, 90d supply, fill #0
  Filled 2022-10-11: qty 3060, 30d supply, fill #1
  Filled 2022-10-12 – 2022-10-13 (×2): qty 3060, 90d supply, fill #1
  Filled 2023-01-17: qty 3060, 90d supply, fill #2

## 2022-07-12 ENCOUNTER — Other Ambulatory Visit (HOSPITAL_COMMUNITY): Payer: Self-pay

## 2022-07-14 ENCOUNTER — Other Ambulatory Visit (HOSPITAL_COMMUNITY): Payer: Self-pay

## 2022-07-14 NOTE — Telephone Encounter (Signed)
Pharmacy benefit is cheaper for the patient. Send rx to [facility] 

## 2022-07-14 NOTE — Telephone Encounter (Signed)
Pt ready for scheduling on or after 07/14/22  Out-of-pocket cost due at time of visit: $276  Primary: [employer]-Medicare Prolia co-insurance: 20% (approximately $276) Admin fee co-insurance: 0%  Secondary:  Prolia co-insurance:  Admin fee co-insurance:   Deductible:   Prior Auth:  PA# Valid:   ** This summary of benefits is an estimation of the patient's out-of-pocket cost. Exact cost may vary based on individual plan coverage.

## 2022-07-14 NOTE — Telephone Encounter (Signed)
Pharmacy Patient Advocate Encounter  Insurance verification completed.    The patient is insured through [employer]   Ran test claims for: Prolia 60mg.  Pharmacy benefit copay: $200.00  

## 2022-07-14 NOTE — Telephone Encounter (Signed)
Prolia VOB initiated via [email or web address] 

## 2022-07-17 ENCOUNTER — Other Ambulatory Visit: Payer: Self-pay | Admitting: *Deleted

## 2022-07-17 ENCOUNTER — Encounter: Payer: Self-pay | Admitting: Family Medicine

## 2022-07-17 NOTE — Telephone Encounter (Signed)
Left message to return call to our office at their convenience.   See information below

## 2022-07-18 NOTE — Telephone Encounter (Signed)
Left message to return call to our office at their convenience.  

## 2022-07-30 ENCOUNTER — Other Ambulatory Visit (HOSPITAL_COMMUNITY): Payer: Self-pay

## 2022-08-01 ENCOUNTER — Other Ambulatory Visit (HOSPITAL_COMMUNITY): Payer: Self-pay

## 2022-08-01 MED ORDER — TIMOLOL MALEATE 0.5 % OP SOLN
1.0000 [drp] | Freq: Every day | OPHTHALMIC | 5 refills | Status: DC
  Filled 2022-08-01: qty 15, 90d supply, fill #0
  Filled 2023-02-21: qty 15, 90d supply, fill #1
  Filled 2023-05-23: qty 15, 90d supply, fill #2

## 2022-09-26 ENCOUNTER — Telehealth: Payer: Self-pay | Admitting: Family Medicine

## 2022-09-26 NOTE — Telephone Encounter (Signed)
Called patient to schedule Medicare Annual Wellness Visit (AWV). Unable to reach patient.  Last date of AWV: N/A  Please schedule an appointment at any time with Otila Kluver, [facility].  MB FULL COULD NOT LEAVE MESSAGE.  If any questions, please contact me at [phone].  Thank you ,  Shaune Pollack [facility] AWV TEAM Direct Dial [phone]

## 2022-09-29 ENCOUNTER — Other Ambulatory Visit (HOSPITAL_COMMUNITY): Payer: Self-pay

## 2022-09-29 ENCOUNTER — Telehealth: Payer: PPO | Admitting: Nurse Practitioner

## 2022-09-29 DIAGNOSIS — R21 Rash and other nonspecific skin eruption: Secondary | ICD-10-CM | POA: Diagnosis not present

## 2022-09-29 MED ORDER — PREDNISONE 10 MG PO TABS
ORAL_TABLET | ORAL | 0 refills | Status: AC
  Filled 2022-09-29: qty 37, 14d supply, fill #0

## 2022-09-29 NOTE — Patient Instructions (Signed)
Take 4 tablets ('40mg'$ ) on days 1-4, then 3 tablets ('30mg'$ ) on days 5-8, then 2 tablets ('20mg'$ ) on days 9-11, then 1 tablet daily for days 12-14. Take with food., Normal

## 2022-09-29 NOTE — Progress Notes (Signed)
Virtual Visit Consent   Norma Greene, you are scheduled for a virtual visit with a [facility] provider today. Just as with appointments in the office, your consent must be obtained to participate. Your consent will be active for this visit and any virtual visit you may have with one of our providers in the next 365 days. If you have a MyChart account, a copy of this consent can be sent to you electronically.  As this is a virtual visit, video technology does not allow for your provider to perform a traditional examination. This may limit your provider's ability to fully assess your condition. If your provider identifies any concerns that need to be evaluated in person or the need to arrange testing (such as labs, EKG, etc.), we will make arrangements to do so. Although advances in technology are sophisticated, we cannot ensure that it will always work on either your end or our end. If the connection with a video visit is poor, the visit may have to be switched to a telephone visit. With either a video or telephone visit, we are not always able to ensure that we have a secure connection.  By engaging in this virtual visit, you consent to the provision of healthcare and authorize for your insurance to be billed (if applicable) for the services provided during this visit. Depending on your insurance coverage, you may receive a charge related to this service.  I need to obtain your verbal consent now. Are you willing to proceed with your visit today? Norma Greene has provided verbal consent on 09/29/2022 for a virtual visit (video or telephone). Apolonio Schneiders, FNP  Date: 09/29/2022 10:28 AM  Virtual Visit via Video Note   I, Apolonio Schneiders, connected with  Norma Greene  ([id number], 27-Nov-1950) on 09/29/22 at 10:30 AM EST by a video-enabled telemedicine application and verified that I am speaking with the correct person using two identifiers.  Location: Patient: Virtual Visit Location Patient:  Home Provider: Virtual Visit Location Provider: Home Office   I discussed the limitations of evaluation and management by telemedicine and the availability of in person appointments. The patient expressed understanding and agreed to proceed.   CC: Rash on face, neck, arms only - occurs annually - unknown cause - no changes in food, soaps etc, no fever or any other s/s- very itchy- unable to sleep - rash raised, red, irregular- Benadryl and topical creams not helping - usually given Prednisone dose pack in past 3-4 yr, after trying OTC meds- I use [facility] employee pharmacy- going out of town tomorrow  History of Present Illness: Norma Greene is a 72 y.o. who identifies as a female who was assigned female at birth, and is being seen today for a recurrent rash.  The rash is currently on her face started on her forehead and has spread down to her axillary regions bilaterally. Started two days ago. She cannot think of anything that day that was out of the ordinary. Denies new foods, lotions, chemicals etc  This has been happening to her on an annual basis without known cause  Does not involve hands typically   This has happened annually over the past 3-4 years. When the rash first occurred it was pre COVID, and when she was working in a different role .  First documented visit was 06/28/2017  She has taken Benadryl for relief from itching but does not relieve the rash itself.   Last year she attempted to wait out the  rash and used Benadryl for one month   She has had shingles in the past (10/2019) and vaccination   She has taken Claritin in the past without results for seasonal allergies  Typically responds best to Allegra   Problems:  Patient Active Problem List   Diagnosis Date Noted   Stress 06/06/2022   Dyslipidemia 05/24/2018   Seasonal allergies, uses Allegra, Flonase, and Singulair 05/24/2018   Dense breast tissue on mammogram, scans in November 05/24/2018   Osteoporosis, prefers no  medications other than calcium + D, Tscore -3.3 11/2021 05/24/2018   Vitamin D deficiency 05/24/2017   IBS (irritable bowel syndrome), constipation predominant 05/17/2014   Insomnia 05/17/2014    Allergies:  Allergies  Allergen Reactions   Pollen Extract Other (See Comments)   Medications:  Current Outpatient Medications:    Calcium Carb-Cholecalciferol (CALCIUM + D3) 600-200 MG-UNIT TABS, Take by mouth., Disp: , Rfl:    ergocalciferol (VITAMIN D2) 1.25 MG (50000 UT) capsule, Take 1 capsule (50,000 Units total) by mouth once a week., Disp: 12 capsule, Rfl: 0   fexofenadine (ALLEGRA) 180 MG tablet, Take 180 mg by mouth daily as needed. , Disp: , Rfl:    fluticasone (FLONASE) 50 MCG/ACT nasal spray, Place 2 sprays into both nostrils daily., Disp: 16 g, Rfl: 6   ibuprofen (ADVIL,MOTRIN) 200 MG tablet, Take 200 mg by mouth every 6 (six) hours as needed., Disp: , Rfl:    montelukast (SINGULAIR) 10 MG tablet, Take 1 tablet (10 mg total) by mouth at bedtime., Disp: 30 tablet, Rfl: 3   Multiple Vitamin (MULTIVITAMIN) tablet, Take 1 tablet by mouth daily., Disp: , Rfl:    polyethylene glycol powder (SM CLEARLAX) 17 GM/SCOOP powder, MIX 1 CAPFUL IN FLUID AND DRINK BY MOUTH TWICE A DAY, Disp: 3060 g, Rfl: 2   SM CLEARLAX 17 GM/SCOOP powder, MIX 1 CAPFUL IN FLUID AND DRINK BY MOUTH TWICE A DAY, Disp: 3060 g, Rfl: 2   timolol (TIMOPTIC) 0.5 % ophthalmic solution, Place 1 drop into both eyes daily., Disp: 15 mL, Rfl: 5   valACYclovir (VALTREX) 1000 MG tablet, Take 1 tablet (1,000 mg total) by mouth daily. Take 1 tablet daily for 5-7 days at first sign of outbreak., Disp: 30 tablet, Rfl: 1  Current Facility-Administered Medications:    denosumab (PROLIA) injection 60 mg, 60 mg, Subcutaneous, Once, Vivi Barrack, MD  Observations/Objective: Patient is well-developed, well-nourished in no acute distress.  Resting comfortably  at home.  Head is normocephalic, atraumatic.  No labored breathing.  Speech  is clear and coherent with logical content.  Patient is alert and oriented at baseline.    Assessment and Plan: 1. Rash and nonspecific skin eruption  - predniSONE (DELTASONE) 10 MG tablet; Take 4 tablets ('40mg'$ ) on days 1-4, then 3 tablets ('30mg'$ ) on days 5-8, then 2 tablets ('20mg'$ ) on days 9-11, then 1 tablet daily for days 12-14. Take with food.  Dispense: 37 tablet; Refill: 0     Follow Up Instructions: I discussed the assessment and treatment plan with the patient. The patient was provided an opportunity to ask questions and all were answered. The patient agreed with the plan and demonstrated an understanding of the instructions.  A copy of instructions were sent to the patient via MyChart unless otherwise noted below.    The patient was advised to call back or seek an in-person evaluation if the symptoms worsen or if the condition fails to improve as anticipated.  Time:  I spent 15 minutes  with the patient via telehealth technology discussing the above problems/concerns.    Apolonio Schneiders, FNP

## 2022-10-03 ENCOUNTER — Telehealth: Payer: Self-pay | Admitting: Family Medicine

## 2022-10-03 NOTE — Telephone Encounter (Signed)
Copied from [place] [id number]. Topic: Medicare AWV >> Oct 03, 2022  1:16 PM Gillis Santa wrote: Reason for CRM: Called patient to schedule Medicare Annual Wellness Visit (AWV). Unable to reach patient.  Last date of AWV: N/A  Please schedule an appointment at any time with Otila Kluver, [facility].  If any questions, please contact me at [phone].  Thank you ,  Shaune Pollack [facility] AWV TEAM Direct Dial [phone]

## 2022-10-11 ENCOUNTER — Other Ambulatory Visit (HOSPITAL_COMMUNITY): Payer: Self-pay

## 2022-10-12 ENCOUNTER — Other Ambulatory Visit (HOSPITAL_COMMUNITY): Payer: Self-pay

## 2022-10-13 ENCOUNTER — Other Ambulatory Visit (HOSPITAL_COMMUNITY): Payer: Self-pay

## 2022-10-16 ENCOUNTER — Other Ambulatory Visit (HOSPITAL_COMMUNITY): Payer: Self-pay

## 2022-10-17 ENCOUNTER — Other Ambulatory Visit (HOSPITAL_COMMUNITY): Payer: Self-pay

## 2022-10-19 ENCOUNTER — Other Ambulatory Visit: Payer: Self-pay | Admitting: Family Medicine

## 2022-10-19 DIAGNOSIS — Z1231 Encounter for screening mammogram for malignant neoplasm of breast: Secondary | ICD-10-CM

## 2022-10-25 ENCOUNTER — Telehealth: Payer: Self-pay | Admitting: Family Medicine

## 2022-10-25 NOTE — Telephone Encounter (Signed)
Copied from [place] [id number]. Topic: Medicare AWV >> Oct 25, 2022 11:25 AM Gillis Santa wrote: Reason for CRM: Called patient to schedule Medicare Annual Wellness Visit (AWV). Unable to reach patient.  Last date of AWV: N/A  Please schedule an appointment at any time with Otila Kluver, [facility]. Please schedule AWVS with Otila Kluver, [place].  If any questions, please contact me at [phone].  Thank you ,  Shaune Pollack [facility] AWV TEAM Direct Dial [phone]

## 2022-11-13 ENCOUNTER — Telehealth: Payer: Self-pay | Admitting: Family Medicine

## 2022-11-13 NOTE — Telephone Encounter (Signed)
Copied from CRM #[id number]. Topic: Medicare AWV >> Nov 13, 2022 10:03 AM Gwenith Spitz wrote: Reason for CRM: Called patient to schedule Medicare Annual Wellness Visit (AWV). Unable to reach patient.  Last date of AWV: N/A  Please schedule an appointment at any time with Inetta Fermo, [facility]. Please schedule AWVS with Inetta Fermo, [facility].  If any questions, please contact me at [phone].  Thank you ,  Gabriel Cirri [facility] AWV TEAM Direct Dial [phone]

## 2022-12-04 ENCOUNTER — Ambulatory Visit
Admission: RE | Admit: 2022-12-04 | Discharge: 2022-12-04 | Disposition: A | Discharge status: Home / Self Care | Payer: PPO | Source: Ambulatory Visit | Attending: Family Medicine | Admitting: Family Medicine

## 2022-12-04 DIAGNOSIS — Z1231 Encounter for screening mammogram for malignant neoplasm of breast: Secondary | ICD-10-CM

## 2022-12-14 ENCOUNTER — Other Ambulatory Visit (HOSPITAL_COMMUNITY): Payer: Self-pay

## 2022-12-14 ENCOUNTER — Telehealth: Payer: Self-pay

## 2022-12-14 NOTE — Telephone Encounter (Signed)
Pt ready for scheduling for Prolia on or after : 12/14/22  Out-of-pocket cost due at time of visit: $302  Primary: [employer] Prolia co-insurance: 20% Admin fee co-insurance: $0  Secondary: N/A Prolia co-insurance:  Admin fee co-insurance:   Medical Benefit Details: Date Benefits were checked: 12/14/22 Deductible: $0/ Coinsurance: 20%/ Admin Fee: $0  Prior Auth: not required PA# Expiration Date:    Pharmacy benefit: Copay $200 If patient wants fill through the pharmacy benefit please send prescription to:  [facility] , and include estimated need by date in rx notes. Pharmacy will ship medication directly to the office.  Patient not eligible for Prolia Copay Card. Copay Card can make patient's cost as little as $25. Link to apply: [email or web address]://[email or web address].[email or web address]/copay  ** This summary of benefits is an estimation of the patient's out-of-pocket cost. Exact cost may very based on individual plan coverage.

## 2022-12-14 NOTE — Telephone Encounter (Signed)
Prolia VOB initiated via [email or web address] 

## 2022-12-15 NOTE — Telephone Encounter (Signed)
See note

## 2022-12-18 NOTE — Telephone Encounter (Signed)
Called to schedule pt but was not able to reach due to number being unavailable. I was able to reach spouse who informed me he would have pt call back for scheduling.

## 2023-01-03 DIAGNOSIS — H40013 Open angle with borderline findings, low risk, bilateral: Secondary | ICD-10-CM | POA: Diagnosis not present

## 2023-01-03 DIAGNOSIS — H2513 Age-related nuclear cataract, bilateral: Secondary | ICD-10-CM | POA: Diagnosis not present

## 2023-01-17 ENCOUNTER — Other Ambulatory Visit: Payer: Self-pay | Admitting: Family Medicine

## 2023-01-17 ENCOUNTER — Other Ambulatory Visit (HOSPITAL_COMMUNITY): Payer: Self-pay

## 2023-01-18 ENCOUNTER — Other Ambulatory Visit (HOSPITAL_COMMUNITY): Payer: Self-pay

## 2023-01-18 MED ORDER — POLYETHYLENE GLYCOL 3350 17 GM/SCOOP PO POWD
ORAL | 2 refills | Status: DC
  Filled 2023-01-18: qty 3094, 90d supply, fill #0
  Filled 2023-04-18 – 2023-04-20 (×2): qty 3094, 90d supply, fill #1
  Filled 2023-11-13: qty 3094, 90d supply, fill #2

## 2023-01-19 ENCOUNTER — Other Ambulatory Visit (HOSPITAL_COMMUNITY): Payer: Self-pay

## 2023-01-19 ENCOUNTER — Encounter: Payer: Self-pay | Admitting: Family Medicine

## 2023-01-22 NOTE — Telephone Encounter (Signed)
Recommend office visit.  Norma Greene. [name], MD 01/22/2023 9:39 AM

## 2023-01-26 ENCOUNTER — Ambulatory Visit (INDEPENDENT_AMBULATORY_CARE_PROVIDER_SITE_OTHER): Payer: PPO | Admitting: Family Medicine

## 2023-01-26 ENCOUNTER — Encounter: Payer: Self-pay | Admitting: Family Medicine

## 2023-01-26 VITALS — BP 130/80 | HR 49 | Temp 97.8°F | Ht 62.5 in | Wt 110.0 lb

## 2023-01-26 DIAGNOSIS — B009 Herpesviral infection, unspecified: Secondary | ICD-10-CM

## 2023-01-26 DIAGNOSIS — J302 Other seasonal allergic rhinitis: Secondary | ICD-10-CM | POA: Diagnosis not present

## 2023-01-26 DIAGNOSIS — R21 Rash and other nonspecific skin eruption: Secondary | ICD-10-CM | POA: Diagnosis not present

## 2023-01-26 NOTE — Progress Notes (Signed)
   Norma Greene is a 72 y.o. female who presents today for an office visit.  Assessment/Plan:  New/Acute Problems: Rash Broad differential includes urticaria, allergic dermatitis, eczema, and psoriasis.  No overlying scale or flaking-doubt eczema or psoriasis but still possible.  Erythema multiforme is also a consideration given her history of HSV however she does not have any classic bull's-eye appearing lesions consistent with this.  She has tried several over-the-counter treatments without any improvement.  We will start once daily antihistamine with allegra or xyzal.  Will also refer to dermatology.  She will follow-up with [place] in a couple weeks and we can adjust dose of antihistamine if needed while she is waiting on her dermatology consult.   Chronic Problems Addressed Today: Seasonal allergies, uses Allegra, Flonase, and Singulair Recommended once daily allergy medications as above to help with her rash.  HSV infection No recent outbreaks.  Symptoms have been well-controlled on Valtrex as needed.  Does not need refill today however does raise possibility for possible erythema multiforme.     Subjective:  HPI:  Patient here with rash.  This has been going on for several months.  She does note that she has a recurrent rash about once per year that is been going on for several years.  This particular rash starts out as bright red lesions on her scalp and head and then spread throughout the rest of her body.  Most recently had a flareup of this type of rash about 4 to 5 months ago.  She had a virtual visit with a different provider for this and was treated with prednisone and did well with this.  Since this rash resolved she has developed a different type of rash.  This has been persistent for the last 4 months or so.  Only located on arms and legs.  Gets very pruritic and erythematous lesions that will then get darker in coloration.  Sometimes feels dry and scaly.  Sometimes gets burning  sensation.  Tried several over-the-counter lotions and creams and emollients without any improvement.      Objective:  Physical Exam: BP 130/80 (BP Location: Left Arm, Patient Position: Sitting, Cuff Size: Normal)   Pulse (!) 49   Temp 97.8 F (36.6 C) (Temporal)   Ht 5' 2.5" (1.588 m)   Wt 110 lb (49.9 kg)   SpO2 100%   BMI 19.80 kg/m   Gen: No acute distress, resting comfortably  Skin: Faintly erythematous macular papular rash involving bilateral forearms and lower extremities.  No overlying silvery scale.  Several areas of hyperpigmentation noted. Neuro: Grossly normal, moves all extremities Psych: Normal affect and thought content      Norma Greene M. [name], MD 01/26/2023 1:28 PM

## 2023-01-26 NOTE — Assessment & Plan Note (Signed)
Recommended once daily allergy medications as above to help with her rash.

## 2023-01-26 NOTE — Patient Instructions (Signed)
It was very nice to see you today!  You may be having urticaria.  Please try taking an allergy medication for the next 1 to 2 weeks.  I will refer you to see the dermatologist.  Send me a message in a couple of weeks to let me know how you are doing.  Return if symptoms worsen or fail to improve.   Take care, Dr [name]  PLEASE NOTE:  If you had any lab tests, please let us know if you have not heard back within a few days. You may see your results on mychart before we have a chance to review them but we will give you a call once they are reviewed by [place].   If we ordered any referrals today, please let us know if you have not heard from their office within the next week.   If you had any urgent prescriptions sent in today, please check with the pharmacy within an hour of our visit to make sure the prescription was transmitted appropriately.   Please try these tips to maintain a healthy lifestyle:  Eat at least 3 REAL meals and 1-2 snacks per day.  Aim for no more than 5 hours between eating.  If you eat breakfast, please do so within one hour of getting up.   Each meal should contain half fruits/vegetables, one quarter protein, and one quarter carbs (no bigger than a computer mouse)  Cut down on sweet beverages. This includes juice, soda, and sweet tea.   Drink at least 1 glass of water with each meal and aim for at least 8 glasses per day  Exercise at least 150 minutes every week.

## 2023-01-26 NOTE — Assessment & Plan Note (Signed)
No recent outbreaks.  Symptoms have been well-controlled on Valtrex as needed.  Does not need refill today however does raise possibility for possible erythema multiforme.

## 2023-03-02 ENCOUNTER — Other Ambulatory Visit (HOSPITAL_COMMUNITY): Payer: Self-pay

## 2023-03-19 ENCOUNTER — Encounter: Payer: Self-pay | Admitting: Family Medicine

## 2023-03-19 NOTE — Telephone Encounter (Signed)
Send information to [facility] referral coordination for information on referral and appointment

## 2023-04-18 ENCOUNTER — Other Ambulatory Visit (HOSPITAL_COMMUNITY): Payer: Self-pay

## 2023-04-20 ENCOUNTER — Other Ambulatory Visit (HOSPITAL_COMMUNITY): Payer: Self-pay

## 2023-04-27 ENCOUNTER — Other Ambulatory Visit (HOSPITAL_BASED_OUTPATIENT_CLINIC_OR_DEPARTMENT_OTHER): Payer: Self-pay

## 2023-04-27 MED ORDER — COVID-19 MRNA VAC-TRIS(PFIZER) 30 MCG/0.3ML IM SUSY
0.3000 mL | PREFILLED_SYRINGE | Freq: Once | INTRAMUSCULAR | 0 refills | Status: AC
  Filled 2023-04-27: qty 0.3, 1d supply, fill #0

## 2023-04-30 ENCOUNTER — Other Ambulatory Visit (HOSPITAL_COMMUNITY): Payer: Self-pay

## 2023-05-03 ENCOUNTER — Encounter: Payer: Self-pay | Admitting: Family Medicine

## 2023-06-08 ENCOUNTER — Encounter: Payer: PPO | Admitting: Family Medicine

## 2023-07-05 DIAGNOSIS — H40013 Open angle with borderline findings, low risk, bilateral: Secondary | ICD-10-CM | POA: Diagnosis not present

## 2023-07-05 DIAGNOSIS — H2513 Age-related nuclear cataract, bilateral: Secondary | ICD-10-CM | POA: Diagnosis not present

## 2023-09-10 ENCOUNTER — Other Ambulatory Visit: Payer: Self-pay | Admitting: *Deleted

## 2023-09-10 ENCOUNTER — Telehealth: Payer: Self-pay | Admitting: *Deleted

## 2023-09-10 NOTE — Telephone Encounter (Signed)
 Copied from CRM [id number]. Topic: Clinical - Medication Question >> Sep 10, 2023  8:09 AM Norma Greene wrote: Reason for CRM: Patient called in stating she will have to cancel her appointment for tomorrow because she has to take care of her husband patient wanted to know if her medications can still be refilled   Patient schedule an CPE visit on 11/08/2023 [facility]

## 2023-09-11 ENCOUNTER — Encounter: Payer: HMO | Admitting: Family Medicine

## 2023-11-08 ENCOUNTER — Encounter: Payer: HMO | Admitting: Family Medicine

## 2023-11-13 ENCOUNTER — Other Ambulatory Visit: Payer: Self-pay | Admitting: Family Medicine

## 2023-11-13 ENCOUNTER — Other Ambulatory Visit (HOSPITAL_COMMUNITY): Payer: Self-pay

## 2023-11-13 DIAGNOSIS — Z1231 Encounter for screening mammogram for malignant neoplasm of breast: Secondary | ICD-10-CM

## 2023-11-14 ENCOUNTER — Other Ambulatory Visit (HOSPITAL_COMMUNITY): Payer: Self-pay

## 2023-11-15 ENCOUNTER — Other Ambulatory Visit (HOSPITAL_COMMUNITY): Payer: Self-pay

## 2023-11-16 ENCOUNTER — Other Ambulatory Visit (HOSPITAL_COMMUNITY): Payer: Self-pay

## 2023-12-05 ENCOUNTER — Ambulatory Visit

## 2023-12-06 ENCOUNTER — Ambulatory Visit
Admission: RE | Admit: 2023-12-06 | Discharge: 2023-12-06 | Disposition: A | Discharge status: Home / Self Care | Source: Ambulatory Visit | Attending: Family Medicine | Admitting: Family Medicine

## 2023-12-06 DIAGNOSIS — Z1231 Encounter for screening mammogram for malignant neoplasm of breast: Secondary | ICD-10-CM | POA: Diagnosis not present

## 2023-12-21 ENCOUNTER — Other Ambulatory Visit (HOSPITAL_COMMUNITY): Payer: Self-pay

## 2024-01-03 DIAGNOSIS — H40013 Open angle with borderline findings, low risk, bilateral: Secondary | ICD-10-CM | POA: Diagnosis not present

## 2024-01-03 DIAGNOSIS — H0102A Squamous blepharitis right eye, upper and lower eyelids: Secondary | ICD-10-CM | POA: Diagnosis not present

## 2024-01-03 DIAGNOSIS — H2513 Age-related nuclear cataract, bilateral: Secondary | ICD-10-CM | POA: Diagnosis not present

## 2024-01-03 DIAGNOSIS — H0102B Squamous blepharitis left eye, upper and lower eyelids: Secondary | ICD-10-CM | POA: Diagnosis not present

## 2024-01-29 ENCOUNTER — Encounter: Payer: Self-pay | Admitting: Family Medicine

## 2024-01-29 ENCOUNTER — Ambulatory Visit: Admitting: Family Medicine

## 2024-01-29 VITALS — BP 132/84 | HR 60 | Temp 98.1°F | Ht 62.5 in | Wt 109.0 lb

## 2024-01-29 DIAGNOSIS — E785 Hyperlipidemia, unspecified: Secondary | ICD-10-CM

## 2024-01-29 DIAGNOSIS — Z0001 Encounter for general adult medical examination with abnormal findings: Secondary | ICD-10-CM

## 2024-01-29 DIAGNOSIS — K581 Irritable bowel syndrome with constipation: Secondary | ICD-10-CM | POA: Diagnosis not present

## 2024-01-29 DIAGNOSIS — E559 Vitamin D deficiency, unspecified: Secondary | ICD-10-CM | POA: Diagnosis not present

## 2024-01-29 DIAGNOSIS — M810 Age-related osteoporosis without current pathological fracture: Secondary | ICD-10-CM | POA: Diagnosis not present

## 2024-01-29 DIAGNOSIS — G4700 Insomnia, unspecified: Secondary | ICD-10-CM | POA: Diagnosis not present

## 2024-01-29 DIAGNOSIS — J302 Other seasonal allergic rhinitis: Secondary | ICD-10-CM | POA: Diagnosis not present

## 2024-01-29 DIAGNOSIS — F439 Reaction to severe stress, unspecified: Secondary | ICD-10-CM | POA: Diagnosis not present

## 2024-01-29 NOTE — Assessment & Plan Note (Signed)
 Patient has been under immense amount of stress the last several months with her husband's ALS diagnosis and rapid decline.  He passed away about a month ago. She has also recently had other stressful medical scares with several of her other family members as well.  Overall symptoms are currently manageable.  She will be meeting with [facility] therapist soon.  She has done very well with this in the past.  We did discuss medications however she would like to hold off on this for now.  She will let us  know if she changes her mind about medications or if she needs any further assistance with this.

## 2024-01-29 NOTE — Assessment & Plan Note (Signed)
 Stable on MiraLAX  daily as needed.  Will refill today.

## 2024-01-29 NOTE — Assessment & Plan Note (Signed)
 Stable on Flonase  and Allegra as needed.

## 2024-01-29 NOTE — Assessment & Plan Note (Signed)
 Low on labs from last year.  Deferred checking today.  We discussed importance of vitamin D  supplementation for bone health.

## 2024-01-29 NOTE — Assessment & Plan Note (Signed)
 Repeat bone density scan with next mammogram.  We discussed calcium and vitamin D  supplementation.

## 2024-01-29 NOTE — Patient Instructions (Addendum)
 It was very nice to see you today!  I am so sorry about your loss. Please let us  know if there is anything we can do to help.   I will refill medications and order a bone density scan.  Return in about 1 year (around 01/28/2025) for Annual Physical.   Take care, Dr Kennyth  PLEASE NOTE:  If you had any lab tests, please let us  know if you have not heard back within a few days. You may see your results on mychart before we have a chance to review them but we will give you a call once they are reviewed by us .   If we ordered any referrals today, please let us  know if you have not heard from their office within the next week.   If you had any urgent prescriptions sent in today, please check with the pharmacy within an hour of our visit to make sure the prescription was transmitted appropriately.   Please try these tips to maintain a healthy lifestyle:  Eat at least 3 REAL meals and 1-2 snacks per day.  Aim for no more than 5 hours between eating.  If you eat breakfast, please do so within one hour of getting up.   Each meal should contain half fruits/vegetables, one quarter protein, and one quarter carbs (no bigger than a computer mouse)  Cut down on sweet beverages. This includes juice, soda, and sweet tea.   Drink at least 1 glass of water with each meal and aim for at least 8 glasses per day  Exercise at least 150 minutes every week.    Preventive Care 59 Years and Older, Female Preventive care refers to lifestyle choices and visits with your health care provider that can promote health and wellness. Preventive care visits are also called wellness exams. What can I expect for my preventive care visit? Counseling Your health care provider may ask you questions about your: Medical history, including: Past medical problems. Family medical history. Pregnancy and menstrual history. History of falls. Current health, including: Memory and ability to understand (cognition). Emotional  well-being. Home life and relationship well-being. Sexual activity and sexual health. Lifestyle, including: Alcohol, nicotine or tobacco, and drug use. Access to firearms. Diet, exercise, and sleep habits. Work and work Astronomer. Sunscreen use. Safety issues such as seatbelt and bike helmet use. Physical exam Your health care provider will check your: Height and weight. These may be used to calculate your BMI (body mass index). BMI is a measurement that tells if you are at a healthy weight. Waist circumference. This measures the distance around your waistline. This measurement also tells if you are at a healthy weight and may help predict your risk of certain diseases, such as type 2 diabetes and high blood pressure. Heart rate and blood pressure. Body temperature. Skin for abnormal spots. What immunizations do I need?  Vaccines are usually given at various ages, according to a schedule. Your health care provider will recommend vaccines for you based on your age, medical history, and lifestyle or other factors, such as travel or where you work. What tests do I need? Screening Your health care provider may recommend screening tests for certain conditions. This may include: Lipid and cholesterol levels. Hepatitis C test. Hepatitis B test. HIV (human immunodeficiency virus) test. STI (sexually transmitted infection) testing, if you are at risk. Lung cancer screening. Colorectal cancer screening. Diabetes screening. This is done by checking your blood sugar (glucose) after you have not eaten for a while (  fasting). Mammogram. Talk with your health care provider about how often you should have regular mammograms. BRCA-related cancer screening. This may be done if you have a family history of breast, ovarian, tubal, or peritoneal cancers. Bone density scan. This is done to screen for osteoporosis. Talk with your health care provider about your test results, treatment options, and if  necessary, the need for more tests. Follow these instructions at home: Eating and drinking  Eat a diet that includes fresh fruits and vegetables, whole grains, lean protein, and low-fat dairy products. Limit your intake of foods with high amounts of sugar, saturated fats, and salt. Take vitamin and mineral supplements as recommended by your health care provider. Do not drink alcohol if your health care provider tells you not to drink. If you drink alcohol: Limit how much you have to 0-1 drink a day. Know how much alcohol is in your drink. In the [place] one drink equals one 12 oz bottle of beer (355 mL), one 5 oz glass of wine (148 mL), or one 1 oz glass of hard liquor (44 mL). Lifestyle Brush your teeth every morning and night with fluoride toothpaste. Floss one time each day. Exercise for at least 30 minutes 5 or more days each week. Do not use any products that contain nicotine or tobacco. These products include cigarettes, chewing tobacco, and vaping devices, such as e-cigarettes. If you need help quitting, ask your health care provider. Do not use drugs. If you are sexually active, practice safe sex. Use a condom or other form of protection in order to prevent STIs. Take aspirin only as told by your health care provider. Make sure that you understand how much to take and what form to take. Work with your health care provider to find out whether it is safe and beneficial for you to take aspirin daily. Ask your health care provider if you need to take a cholesterol-lowering medicine (statin). Find healthy ways to manage stress, such as: Meditation, yoga, or listening to music. Journaling. Talking to a trusted person. Spending time with friends and family. Minimize exposure to UV radiation to reduce your risk of skin cancer. Safety Always wear your seat belt while driving or riding in a vehicle. Do not drive: If you have been drinking alcohol. Do not ride with someone who has been  drinking. When you are tired or distracted. While texting. If you have been using any mind-altering substances or drugs. Wear a helmet and other protective equipment during sports activities. If you have firearms in your house, make sure you follow all gun safety procedures. What's next? Visit your health care provider once a year for an annual wellness visit. Ask your health care provider how often you should have your eyes and teeth checked. Stay up to date on all vaccines. This information is not intended to replace advice given to you by your health care provider. Make sure you discuss any questions you have with your health care provider. Document Revised: 01/12/2021 Document Reviewed: 01/12/2021 Elsevier Patient Education  2024 [employer].

## 2024-01-29 NOTE — Assessment & Plan Note (Signed)
 Mildly elevated LDL on labs last year.  She has not had [facility] chest work in last interventions recently.  Deferred checking today.  Will recheck again at next office visit.

## 2024-01-29 NOTE — Progress Notes (Signed)
 Chief Complaint:  Norma Greene is a 73 y.o. female who presents today for her annual comprehensive physical exam.    Assessment/Plan:  Chronic Problems Addressed Today: Stress Patient has been under immense amount of stress the last several months with her husband's ALS diagnosis and rapid decline.  He passed away about a month ago. She has also recently had other stressful medical scares with several of her other family members as well.  Overall symptoms are currently manageable.  She will be meeting with [facility] therapist soon.  She has done very well with this in the past.  We did discuss medications however she would like to hold off on this for now.  She will let us  know if she changes her mind about medications or if she needs any further assistance with this.  Osteoporosis, prefers no medications other than calcium + D, Tscore -3.3 11/2021 Repeat bone density scan with next mammogram.  We discussed calcium and vitamin D  supplementation.  IBS (irritable bowel syndrome), constipation predominant Stable on MiraLAX  daily as needed.  Will refill today.  Vitamin D  deficiency Low on labs from last year.  Deferred checking today.  We discussed importance of vitamin D  supplementation for bone health.  Dyslipidemia Mildly elevated LDL on labs last year.  She has not had [facility] chest work in last interventions recently.  Deferred checking today.  Will recheck again at next office visit.  Seasonal allergies, uses Allegra, Flonase , and Singulair  Stable on Flonase  and Allegra as needed.  Preventative Healthcare: Labs deferred.  Will order bone density scan.  Due for colonoscopy next year.  Patient Counseling(The following topics were reviewed and/or handout was given):  -Nutrition: Stressed importance of moderation in sodium/caffeine intake, saturated fat and cholesterol, caloric balance, sufficient intake of fresh fruits, vegetables, and fiber.  -Stressed the importance of regular exercise.    -Substance Abuse: Discussed cessation/primary prevention of tobacco, alcohol, or other drug use; driving or other dangerous activities under the influence; availability of treatment for abuse.   -Injury prevention: Discussed safety belts, safety helmets, smoke detector, smoking near bedding or upholstery.   -Sexuality: Discussed sexually transmitted diseases, partner selection, use of condoms, avoidance of unintended pregnancy and contraceptive alternatives.   -Dental health: Discussed importance of regular tooth brushing, flossing, and dental visits.  -Health maintenance and immunizations reviewed. Please refer to Health maintenance section.  Return to care in 1 year for next preventative visit.     Subjective:  HPI:  See A/P for status of chronic conditions.  Patient is here today for annual physical.  She recently lost her husband after several months of battling ALS.  He passed away about a month ago.  She has been under quite a bit of stress related to this.  She does have good support at home and will be seeing a therapist soon.      01/29/2024   12:58 PM  Depression screen PHQ 2/9  Decreased Interest 0  Down, Depressed, Hopeless 0  PHQ - 2 Score 0  Altered sleeping 0  Tired, decreased energy 0  Change in appetite 0  Feeling bad or failure about yourself  0  Trouble concentrating 0  Moving slowly or fidgety/restless 0  Suicidal thoughts 0  PHQ-9 Score 0  Difficult doing work/chores Not difficult at all    Health Maintenance Due  Topic Date Due   Medicare Annual Wellness (AWV)  Never done   Hepatitis B Vaccines (2 of 3 - 19+ 3-dose series) 04/12/1998  COVID-19 Vaccine (7 - 2024-25 season) 10/25/2023   DEXA SCAN  12/02/2023     ROS: Per HPI, otherwise a complete review of systems was negative.   PMH:  The following were reviewed and entered/updated in epic: Past Medical History:  Diagnosis Date   Allergy-induced asthma    Chicken pox as a child   Headache  05/17/2014   History of chicken pox    Hyperlipidemia, mild 05/23/2015   IBS (irritable bowel syndrome)    Dr. Luis   Insomnia 05/17/2014   Measles as a child   Palpitations 05/17/2015   Seasonal allergies    Patient Active Problem List   Diagnosis Date Noted   HSV infection 01/26/2023   Stress 06/06/2022   Dyslipidemia 05/24/2018   Seasonal allergies, uses Allegra, Flonase , and Singulair  05/24/2018   Dense breast tissue on mammogram, scans in November 05/24/2018   Osteoporosis, prefers no medications other than calcium + D, Tscore -3.3 11/2021 05/24/2018   Vitamin D  deficiency 05/24/2017   IBS (irritable bowel syndrome), constipation predominant 05/17/2014   Insomnia 05/17/2014   Past Surgical History:  Procedure Laterality Date   BREAST BIOPSY     BREAST EXCISIONAL BIOPSY     BREAST SURGERY Bilateral    Lumpectomy   DILATION AND CURETTAGE OF UTERUS  1983   Missed AB   MANDIBLE SURGERY  1985 and 1996   WISDOM TOOTH EXTRACTION  1972    Family History  Problem Relation Age of Onset   Hypertension Father    Hyperlipidemia Father    Heart failure Father    Atrial fibrillation Father    Cancer Father        lung, soft tissue in hand, thigh, basal cell in ear- smoker then quit   Atrial fibrillation Mother    Dementia Mother    Hypertension Son    Cancer Maternal Grandmother        ? breast cancer- masectomy   Breast cancer Maternal Grandmother    Heart disease Paternal Grandmother    Heart disease Paternal Grandfather    Depression Daughter    Heart attack Neg Hx    Sudden death Neg Hx     Medications- reviewed and updated Current Outpatient Medications  Medication Sig Dispense Refill   Calcium Carb-Cholecalciferol (CALCIUM + D3) 600-200 MG-UNIT TABS Take by mouth.     fluticasone  (FLONASE ) 50 MCG/ACT nasal spray Place 2 sprays into both nostrils daily. 16 g 6   ibuprofen (ADVIL,MOTRIN) 200 MG tablet Take 200 mg by mouth every 6 (six) hours as needed.      montelukast  (SINGULAIR ) 10 MG tablet Take 1 tablet (10 mg total) by mouth at bedtime. 30 tablet 3   Multiple Vitamin (MULTIVITAMIN) tablet Take 1 tablet by mouth daily.     timolol  (TIMOPTIC ) 0.5 % ophthalmic solution Place 1 drop into both eyes daily. 15 mL 5   valACYclovir  (VALTREX ) 1000 MG tablet Take 1 tablet (1,000 mg total) by mouth daily. Take 1 tablet daily for 5-7 days at first sign of outbreak. 30 tablet 1   fexofenadine (ALLEGRA) 180 MG tablet Take 180 mg by mouth daily as needed.      No current facility-administered medications for this visit.    Allergies-reviewed and updated Allergies  Allergen Reactions   Pollen Extract Other (See Comments)    Social History   Socioeconomic History   Marital status: Married    Spouse name: Not on file   Number of children: 5   Years of  education: Nursing    Highest education level: Bachelor's degree (e.g., BA, AB, BS)  Occupational History    Employer: [employer]   Occupation: Teacher, adult education: [employer]  Tobacco Use   Smoking status: Never   Smokeless tobacco: Never  Substance and Sexual Activity   Alcohol use: Yes    Comment: Wine on weekends   Drug use: No   Sexual activity: Yes    Partners: Male    Comment: lives with husband and works for [employer] residency program at [employer],  no dietary restrictions, avoids milk  Other Topics Concern   Not on file  Social History Narrative   Not on file   Social Drivers of Health   Financial Resource Strain: Low Risk  (01/24/2023)   Overall Financial Resource Strain (CARDIA)    Difficulty of Paying Living Expenses: Not hard at all  Food Insecurity: No Food Insecurity (01/24/2023)   Hunger Vital Sign    Worried About Running Out of Food in the Last Year: Never true    Ran Out of Food in the Last Year: Never true  Transportation Needs: No Transportation Needs (01/24/2023)   PRAPARE - Administrator, Civil Service (Medical): No    Lack of Transportation (Non-Medical): No   Physical Activity: Sufficiently Active (01/24/2023)   Exercise Vital Sign    Days of Exercise per Week: 2 days    Minutes of Exercise per Session: 90 min  Stress: Stress Concern Present (01/24/2023)   [employer] of Occupational Health - Occupational Stress Questionnaire    Feeling of Stress : To some extent  Social Connections: Moderately Integrated (01/24/2023)   Social Connection and Isolation Panel    Frequency of Communication with Friends and Family: More than three times a week    Frequency of Social Gatherings with Friends and Family: Once a week    Attends Religious Services: 1 to 4 times per year    Active Member of [employer] or Organizations: No    Attends Engineer, structural: Not on file    Marital Status: Married        Objective:  Physical Exam: BP 132/84   Pulse 60   Temp 98.1 F (36.7 C)   Ht 5' 2.5 (1.588 m)   Wt 109 lb (49.4 kg)   SpO2 95%   BMI 19.62 kg/m   Body mass index is 19.62 kg/m. Wt Readings from Last 3 Encounters:  01/29/24 109 lb (49.4 kg)  01/26/23 110 lb (49.9 kg)  06/06/22 106 lb 6.4 oz (48.3 kg)   Gen: NAD, resting comfortably HEENT: TMs normal bilaterally. OP clear. No thyromegaly noted.  CV: RRR with no murmurs appreciated Pulm: NWOB, CTAB with no crackles, wheezes, or rhonchi GI: Normal bowel sounds present. Soft, Nontender, Nondistended. MSK: no edema, cyanosis, or clubbing noted Skin: warm, dry Neuro: CN2-12 grossly intact. Strength 5/5 in upper and lower extremities. Reflexes symmetric and intact bilaterally.  Psych: Normal affect and thought content     Marifer Hurd M. Kennyth, MD 01/29/2024 1:45 PM

## 2024-03-09 ENCOUNTER — Other Ambulatory Visit (HOSPITAL_COMMUNITY): Payer: Self-pay

## 2024-03-10 ENCOUNTER — Other Ambulatory Visit (HOSPITAL_COMMUNITY): Payer: Self-pay

## 2024-03-10 MED ORDER — TIMOLOL MALEATE 0.5 % OP SOLN
1.0000 [drp] | Freq: Every day | OPHTHALMIC | 3 refills | Status: AC
  Filled 2024-03-10: qty 15, 90d supply, fill #0
  Filled 2024-03-10: qty 15, 100d supply, fill #0
  Filled 2024-06-16: qty 15, 100d supply, fill #1
  Filled 2024-06-16: qty 15, 150d supply, fill #0

## 2024-04-10 ENCOUNTER — Other Ambulatory Visit: Payer: Self-pay | Admitting: Family Medicine

## 2024-04-10 ENCOUNTER — Encounter (HOSPITAL_COMMUNITY): Payer: Self-pay

## 2024-04-10 ENCOUNTER — Telehealth: Payer: Self-pay | Admitting: *Deleted

## 2024-04-10 ENCOUNTER — Other Ambulatory Visit (HOSPITAL_COMMUNITY): Payer: Self-pay

## 2024-04-10 ENCOUNTER — Encounter: Payer: Self-pay | Admitting: Family Medicine

## 2024-04-10 NOTE — Telephone Encounter (Signed)
 Copied from CRM #[id number]. Topic: Clinical - Prescription Issue >> Apr 10, 2024  3:01 PM Deaijah H wrote: Reason for CRM: Patient would like to have polyethylene glycol powder (SM CLEARLAX) 17 GM/SCOOP powder refilled. Prescription is no longer on list and refusal reason Refill not appropriate . Please call [phone] Also stated Dr. Kennyth referred her to [facility] but they stated they only have one location which is [facility] but cannot get ahold of anyone at office to schedule and would like further assistance on getting contact.   Please advise  Asuka Dusseau,RMA

## 2024-04-11 NOTE — Telephone Encounter (Signed)
 Ok to refill her MiraLAX .  We clarify regarding her [facility] referral?  If she referring to the DEXA scan?

## 2024-04-11 NOTE — Telephone Encounter (Signed)
 See note

## 2024-04-11 NOTE — Telephone Encounter (Signed)
 Please see previous message.  Okay to refill MiraLAX .  Please check on status of her bone density scan

## 2024-04-14 ENCOUNTER — Telehealth: Payer: Self-pay | Admitting: *Deleted

## 2024-04-14 ENCOUNTER — Other Ambulatory Visit: Payer: Self-pay | Admitting: *Deleted

## 2024-04-14 ENCOUNTER — Other Ambulatory Visit (HOSPITAL_BASED_OUTPATIENT_CLINIC_OR_DEPARTMENT_OTHER): Payer: Self-pay

## 2024-04-14 MED ORDER — POLYETHYLENE GLYCOL 3350 17 GM/SCOOP PO POWD
ORAL | 2 refills | Status: AC
  Filled 2024-04-14 – 2024-04-16 (×3): qty 3060, 90d supply, fill #0
  Filled 2024-07-14: qty 3060, 90d supply, fill #1

## 2024-04-14 NOTE — Telephone Encounter (Signed)
 Duplicated, rx send

## 2024-04-14 NOTE — Telephone Encounter (Signed)
 Copied from CRM [id number]. Topic: Clinical - Medication Question >> Apr 14, 2024  9:59 AM Burnard DEL wrote: Reason for CRM: Patient called to check on the status of prescription for miralax  being sent to pharmacy for her? She stated that she is going out of town tomorrow and doesn't want to go without.  [facility]  Phone: [phone] Fax: [phone]   Prescription was send to [facility]  [facility]

## 2024-04-15 ENCOUNTER — Other Ambulatory Visit: Payer: Self-pay

## 2024-04-15 ENCOUNTER — Other Ambulatory Visit (HOSPITAL_BASED_OUTPATIENT_CLINIC_OR_DEPARTMENT_OTHER): Payer: Self-pay

## 2024-04-15 NOTE — Telephone Encounter (Signed)
 noted

## 2024-04-16 ENCOUNTER — Other Ambulatory Visit (HOSPITAL_BASED_OUTPATIENT_CLINIC_OR_DEPARTMENT_OTHER): Payer: Self-pay

## 2024-04-16 ENCOUNTER — Other Ambulatory Visit: Payer: Self-pay

## 2024-04-25 ENCOUNTER — Other Ambulatory Visit (HOSPITAL_BASED_OUTPATIENT_CLINIC_OR_DEPARTMENT_OTHER): Payer: Self-pay

## 2024-04-25 MED ORDER — COMIRNATY 30 MCG/0.3ML IM SUSY
0.3000 mL | PREFILLED_SYRINGE | Freq: Once | INTRAMUSCULAR | 0 refills | Status: AC
  Filled 2024-04-25: qty 0.3, 1d supply, fill #0

## 2024-04-25 MED ORDER — FLUZONE HIGH-DOSE 0.5 ML IM SUSY
0.5000 mL | PREFILLED_SYRINGE | Freq: Once | INTRAMUSCULAR | 0 refills | Status: AC
  Filled 2024-04-25: qty 0.5, 1d supply, fill #0

## 2024-05-31 ENCOUNTER — Ambulatory Visit (HOSPITAL_BASED_OUTPATIENT_CLINIC_OR_DEPARTMENT_OTHER)
Admission: RE | Admit: 2024-05-31 | Discharge: 2024-05-31 | Disposition: A | Source: Ambulatory Visit | Attending: Family Medicine | Admitting: Family Medicine

## 2024-05-31 DIAGNOSIS — M810 Age-related osteoporosis without current pathological fracture: Secondary | ICD-10-CM | POA: Diagnosis not present

## 2024-06-03 ENCOUNTER — Ambulatory Visit: Payer: Self-pay | Admitting: Family Medicine

## 2024-06-03 DIAGNOSIS — M810 Age-related osteoporosis without current pathological fracture: Secondary | ICD-10-CM

## 2024-06-03 NOTE — Progress Notes (Signed)
 Bone density scan shows very slight worsening osteoporosis compared to her last scan a couple of years ago.  We can refer her to the osteoporosis clinic for discussion for further management if she is agreeable.

## 2024-06-16 ENCOUNTER — Other Ambulatory Visit (HOSPITAL_BASED_OUTPATIENT_CLINIC_OR_DEPARTMENT_OTHER): Payer: Self-pay

## 2024-06-16 ENCOUNTER — Other Ambulatory Visit: Payer: Self-pay

## 2024-06-16 ENCOUNTER — Encounter: Payer: Self-pay | Admitting: Physician Assistant

## 2024-06-16 ENCOUNTER — Other Ambulatory Visit (HOSPITAL_COMMUNITY): Payer: Self-pay

## 2024-06-16 ENCOUNTER — Ambulatory Visit: Admitting: Physician Assistant

## 2024-06-16 DIAGNOSIS — M810 Age-related osteoporosis without current pathological fracture: Secondary | ICD-10-CM

## 2024-06-16 NOTE — Progress Notes (Addendum)
 Office Visit Note   Patient: Norma Greene           Date of Birth: 30-Sep-1950           MRN: [id number] Visit Date: 06/16/2024              Requested by: Norma Worth [name], MD [address] [place],  [place] [address] PCP: Norma Worth [name], MD   Assessment & Plan: Visit Diagnoses:  1. 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  She was referred by Dr. Kennyth for evaluation of osteoporosis.  She has previously tried Fosamax after low bone density scan years ago.  Unfortunately she could not tolerate it was unable to swallow the pills and had significant reflux with it.  She has also had difficulty with calcium and vitamin D .  She is has no history of fracture no history of heart disease no history of cancer she does have a history of IBS.  No history of kidney disease.  She has no history of gastric bypass.  Again moderate reflux making it unable for her to tolerate Fosamax.  She went through menopause at 80 without any hormone replacement therapy.  She has not had labs in 2 years.  She drinks occasionally.  She does workout at the gym including resistance training.  She has had a history of reconstruction of her lower jaw but unclear that any of this was due to AVN.  No family history of fragility fracture in her family.  Of note the jaw was 8+ years ago.  She has had some loosening she feels with the graft.  But no evidence of infection she is going to follow-up with her dentist.  Based on the fact that she has a significantly worsening bone density scan with a score of -3.4.  She has an overall risk of 9.7% of hip fracture in the next 10 years I do believe she should go forward with Evenity.  I think this would be the best option for her followed by Prolia  or Jubbonti.  She is changing insurances in January we will get the information for her new insurance so we can go ahead and try and see  what the benefits might be.  Meantime we will draw labs today.  I will contact her if any of the labs need to be addressed as she is a user on MyChart.  45 minutes was taken reviewing her chart prior to her visit as well as spending time with her discussing treatments  Follow-Up Instructions: Return if symptoms worsen or fail to improve.   Orders:  No orders of the defined types were placed in this encounter.  No orders of the defined types were placed in this encounter.     Procedures: No procedures performed   Clinical Data: No additional findings.   Subjective: No chief complaint on file.   HPI patient is a 73 year old woman comes in today referred by Dr. Kennyth for evaluation of osteoporosis.  Review of Systems  All other systems reviewed and are negative.    Objective: Vital Signs: There were no vitals taken for this visit.  Physical Exam Constitutional:      Appearance: Normal appearance.  Skin:    General: Skin is warm and dry.  Neurological:     General: No focal deficit present.     Mental Status: She is alert and oriented  to person, place, and time.  Psychiatric:        Mood and Affect: Mood normal.        Behavior: Behavior normal.     Ortho Exam  Specialty Comments:  No specialty comments available.  Imaging: No results found.   PMFS History: Patient Active Problem List   Diagnosis Date Noted  . HSV infection 01/26/2023  . Stress 06/06/2022  . Dyslipidemia 05/24/2018  . Seasonal allergies, uses Allegra, Flonase , and Singulair  05/24/2018  . Dense breast tissue on mammogram, scans in November 05/24/2018  . Age-related osteoporosis without current pathological fracture 05/24/2018  . Vitamin D  deficiency 05/24/2017  . IBS (irritable bowel syndrome), constipation predominant 05/17/2014  . Insomnia 05/17/2014   Past Medical History:  Diagnosis Date  . Allergy-induced asthma   . Chicken pox as a child  . Headache 05/17/2014  . History of  chicken pox   . Hyperlipidemia, mild 05/23/2015  . IBS (irritable bowel syndrome)    Dr. Luis  . Insomnia 05/17/2014  . Measles as a child  . Palpitations 05/17/2015  . Seasonal allergies     Family History  Problem Relation Age of Onset  . Hypertension Father   . Hyperlipidemia Father   . Heart failure Father   . Atrial fibrillation Father   . Cancer Father        lung, soft tissue in hand, thigh, basal cell in ear- smoker then quit  . Atrial fibrillation Mother   . Dementia Mother   . Hypertension Son   . Cancer Maternal Grandmother        ? breast cancer- masectomy  . Breast cancer Maternal Grandmother   . Heart disease Paternal Grandmother   . Heart disease Paternal Grandfather   . Depression Daughter   . Heart attack Neg Hx   . Sudden death Neg Hx     Past Surgical History:  Procedure Laterality Date  . BREAST BIOPSY    . BREAST EXCISIONAL BIOPSY    . BREAST SURGERY Bilateral    Lumpectomy  . DILATION AND CURETTAGE OF UTERUS  1983   Missed AB  . MANDIBLE SURGERY  1985 and 1996  . WISDOM TOOTH EXTRACTION  1972   Social History   Occupational History    Employer: [employer]  . Occupation: Teacher, Adult Education: [employer]  Tobacco Use  . Smoking status: Never  . Smokeless tobacco: Never  Substance and Sexual Activity  . Alcohol use: Yes    Comment: Wine on weekends  . Drug use: No  . 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

## 2024-06-16 NOTE — Progress Notes (Incomplete Revision)
 Office Visit Note   Patient: Norma Greene           Date of Birth: 14-Dec-1950           MRN: [id number] Visit Date: 06/16/2024              Requested by: Kennyth Worth [name], MD [address] [place],  [place] [address] PCP: Kennyth Worth [name], MD   Assessment & Plan: Visit Diagnoses:  1. 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  She was referred by Dr. Kennyth for evaluation of osteoporosis.  She has previously tried Fosamax after low bone density scan years ago.  Unfortunately she could not tolerate it was unable to swallow the pills and had significant reflux with it.  She has also had difficulty with calcium and vitamin D .  She is has no history of fracture no history of heart disease no history of cancer she does have a history of IBS.  No history of kidney disease.  She has no history of gastric bypass.  Again moderate reflux making it unable for her to tolerate Fosamax.  She went through menopause at 14 without any hormone replacement therapy.  She has not had labs in 2 years.  She drinks occasionally.  She does workout at the gym including resistance training.  She has had a history of reconstruction of her lower jaw but unclear that any of this was due to AVN.  No family history of fragility fracture in her family.  Of note the jaw was 8+ years ago.  She has had some loosening she feels with the graft.  But no evidence of infection she is going to follow-up with her dentist.  Based on the fact that she has a significantly worsening bone density scan with a score of -3.4.  She has an overall risk of 9.7% of hip fracture in the next 10 years I do believe she should go forward with Evenity.  I think this would be the best option for her followed by Prolia  or Jubbonti.  She is changing insurances in January we will get the information for her new insurance so we can go ahead and try and see  what the benefits might be.  Meantime we will draw labs today.  I will contact her if any of the labs need to be addressed as she is a user on MyChart.  45 minutes was taken reviewing her chart prior to her visit as well as spending time with her discussing treatments  Follow-Up Instructions: Return if symptoms worsen or fail to improve.   Orders:  No orders of the defined types were placed in this encounter.  No orders of the defined types were placed in this encounter.     Procedures: No procedures performed   Clinical Data: No additional findings.   Subjective: No chief complaint on file.   HPI patient is a 73 year old woman comes in today referred by Dr. Kennyth for evaluation of osteoporosis.  Review of Systems  All other systems reviewed and are negative.    Objective: Vital Signs: There were no vitals taken for this visit.  Physical Exam Constitutional:      Appearance: Normal appearance.  Skin:    General: Skin is warm and dry.  Neurological:     General: No focal deficit present.     Mental Status: She is alert and oriented  to person, place, and time.  Psychiatric:        Mood and Affect: Mood normal.        Behavior: Behavior normal.     Ortho Exam  Specialty Comments:  No specialty comments available.  Imaging: No results found.   PMFS History: Patient Active Problem List   Diagnosis Date Noted  . HSV infection 01/26/2023  . Stress 06/06/2022  . Dyslipidemia 05/24/2018  . Seasonal allergies, uses Allegra, Flonase , and Singulair  05/24/2018  . Dense breast tissue on mammogram, scans in November 05/24/2018  . Age-related osteoporosis without current pathological fracture 05/24/2018  . Vitamin D  deficiency 05/24/2017  . IBS (irritable bowel syndrome), constipation predominant 05/17/2014  . Insomnia 05/17/2014   Past Medical History:  Diagnosis Date  . Allergy-induced asthma   . Chicken pox as a child  . Headache 05/17/2014  . History of  chicken pox   . Hyperlipidemia, mild 05/23/2015  . IBS (irritable bowel syndrome)    Dr. Luis  . Insomnia 05/17/2014  . Measles as a child  . Palpitations 05/17/2015  . Seasonal allergies     Family History  Problem Relation Age of Onset  . Hypertension Father   . Hyperlipidemia Father   . Heart failure Father   . Atrial fibrillation Father   . Cancer Father        lung, soft tissue in hand, thigh, basal cell in ear- smoker then quit  . Atrial fibrillation Mother   . Dementia Mother   . Hypertension Son   . Cancer Maternal Grandmother        ? breast cancer- masectomy  . Breast cancer Maternal Grandmother   . Heart disease Paternal Grandmother   . Heart disease Paternal Grandfather   . Depression Daughter   . Heart attack Neg Hx   . Sudden death Neg Hx     Past Surgical History:  Procedure Laterality Date  . BREAST BIOPSY    . BREAST EXCISIONAL BIOPSY    . BREAST SURGERY Bilateral    Lumpectomy  . DILATION AND CURETTAGE OF UTERUS  1983   Missed AB  . MANDIBLE SURGERY  1985 and 1996  . WISDOM TOOTH EXTRACTION  1972   Social History   Occupational History    Employer: [employer]  . Occupation: Teacher, Adult Education: [employer]  Tobacco Use  . Smoking status: Never  . Smokeless tobacco: Never  Substance and Sexual Activity  . Alcohol use: Yes    Comment: Wine on weekends  . Drug use: No  . 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

## 2024-06-16 NOTE — Addendum Note (Signed)
 Addended by: Cadel Stairs on: 06/16/2024 08:49 AM   Modules accepted: Orders

## 2024-06-17 LAB — BASIC METABOLIC PANEL WITH GFR
BUN: 17 mg/dL (ref 7–25)
CO2: 30 mmol/L (ref 20–32)
Calcium: 10.1 mg/dL (ref 8.6–10.4)
Chloride: 106 mmol/L (ref 98–110)
Creat: 0.82 mg/dL (ref 0.60–1.00)
Glucose, Bld: 88 mg/dL (ref 65–99)
Potassium: 5.3 mmol/L (ref 3.5–5.3)
Sodium: 142 mmol/L (ref 135–146)
eGFR: 75 mL/min/1.73m2 (ref >=60)

## 2024-06-17 LAB — CBC
HCT: 42.7 % (ref 35.0–45.0)
Hemoglobin: 14.5 g/dL (ref 11.7–15.5)
MCH: 30.5 pg (ref 27.0–33.0)
MCHC: 34 g/dL (ref 32.0–36.0)
MCV: 89.9 fL (ref 80.0–100.0)
MPV: 11.1 fL (ref 7.5–12.5)
Platelets: 312 Thousand/uL (ref 140–400)
RBC: 4.75 Million/uL (ref 3.80–5.10)
RDW: 12.8 % (ref 11.0–15.0)
WBC: 4 Thousand/uL (ref 3.8–10.8)

## 2024-06-17 LAB — VITAMIN D 25 HYDROXY (VIT D DEFICIENCY, FRACTURES): Vit D, 25-Hydroxy: 26 ng/mL — ABNORMAL LOW (ref 30–100)

## 2024-06-19 ENCOUNTER — Ambulatory Visit: Payer: Self-pay | Admitting: Physician Assistant

## 2024-07-04 ENCOUNTER — Other Ambulatory Visit: Payer: Self-pay | Admitting: Radiology

## 2024-07-04 DIAGNOSIS — M810 Age-related osteoporosis without current pathological fracture: Secondary | ICD-10-CM

## 2024-07-04 MED ORDER — DENOSUMAB 60 MG/ML ~~LOC~~ SOSY: 60.0000 mg | PREFILLED_SYRINGE | Freq: Once | SUBCUTANEOUS | Status: AC

## 2024-07-14 ENCOUNTER — Other Ambulatory Visit (HOSPITAL_BASED_OUTPATIENT_CLINIC_OR_DEPARTMENT_OTHER): Payer: Self-pay

## 2024-07-15 ENCOUNTER — Other Ambulatory Visit (HOSPITAL_BASED_OUTPATIENT_CLINIC_OR_DEPARTMENT_OTHER): Payer: Self-pay

## 2024-09-05 ENCOUNTER — Telehealth: Payer: Self-pay | Admitting: Radiology

## 2024-09-05 NOTE — Telephone Encounter (Signed)
 Patient called, and has lots of questions about starting Jubbonti.  Can you please call her to discuss?  Thanks.
